# Patient Record
Sex: Male | Born: 1995 | Race: White | Hispanic: No | Marital: Single | Smoking: Current some day smoker
Health system: Southern US, Community
[De-identification: ages and names within clinical notes are randomized; demographics above are authoritative.]

## PROBLEM LIST (undated history)

## (undated) DIAGNOSIS — L0291 Cutaneous abscess, unspecified: Secondary | ICD-10-CM

## (undated) DIAGNOSIS — M40209 Unspecified kyphosis, site unspecified: Secondary | ICD-10-CM

## (undated) DIAGNOSIS — J45909 Unspecified asthma, uncomplicated: Secondary | ICD-10-CM

## (undated) DIAGNOSIS — G8929 Other chronic pain: Secondary | ICD-10-CM

## (undated) DIAGNOSIS — M419 Scoliosis, unspecified: Secondary | ICD-10-CM

## (undated) DIAGNOSIS — M549 Dorsalgia, unspecified: Secondary | ICD-10-CM

## (undated) DIAGNOSIS — R042 Hemoptysis: Secondary | ICD-10-CM

## (undated) HISTORY — PX: LIVER CYST REMOVAL: SHX5951

## (undated) HISTORY — PX: BACK SURGERY: SHX140

---
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
## 2014-01-30 ENCOUNTER — Emergency Department: Payer: Self-pay | Admitting: Internal Medicine

## 2014-02-18 ENCOUNTER — Ambulatory Visit: Payer: Self-pay | Admitting: Orthopaedic Surgery

## 2014-10-04 ENCOUNTER — Emergency Department: Payer: Self-pay | Admitting: Emergency Medicine

## 2015-02-17 ENCOUNTER — Ambulatory Visit: Payer: Medicaid Other

## 2015-02-17 ENCOUNTER — Ambulatory Visit
Admission: EM | Admit: 2015-02-17 | Discharge: 2015-02-17 | Disposition: A | Discharge status: Home / Self Care | Payer: Medicaid Other | Attending: Internal Medicine | Admitting: Internal Medicine

## 2015-02-17 ENCOUNTER — Encounter: Payer: Self-pay | Admitting: Emergency Medicine

## 2015-02-17 DIAGNOSIS — F17200 Nicotine dependence, unspecified, uncomplicated: Secondary | ICD-10-CM | POA: Insufficient documentation

## 2015-02-17 DIAGNOSIS — G8929 Other chronic pain: Secondary | ICD-10-CM | POA: Diagnosis not present

## 2015-02-17 DIAGNOSIS — M40209 Unspecified kyphosis, site unspecified: Secondary | ICD-10-CM | POA: Diagnosis not present

## 2015-02-17 DIAGNOSIS — M419 Scoliosis, unspecified: Secondary | ICD-10-CM | POA: Insufficient documentation

## 2015-02-17 DIAGNOSIS — J45909 Unspecified asthma, uncomplicated: Secondary | ICD-10-CM | POA: Diagnosis not present

## 2015-02-17 DIAGNOSIS — M549 Dorsalgia, unspecified: Secondary | ICD-10-CM

## 2015-02-17 HISTORY — DX: Dorsalgia, unspecified: M54.9

## 2015-02-17 HISTORY — DX: Other chronic pain: G89.29

## 2015-02-17 HISTORY — DX: Unspecified kyphosis, site unspecified: M40.209

## 2015-02-17 HISTORY — DX: Cutaneous abscess, unspecified: L02.91

## 2015-02-17 HISTORY — DX: Hemoptysis: R04.2

## 2015-02-17 HISTORY — DX: Unspecified asthma, uncomplicated: J45.909

## 2015-02-17 MED ORDER — OXYCODONE-ACETAMINOPHEN 5-325 MG PO TABS: 1.0000 | ORAL_TABLET | ORAL | Status: DC | PRN

## 2015-02-17 MED ORDER — IBUPROFEN 800 MG PO TABS: 800.0000 mg | ORAL_TABLET | Freq: Three times a day (TID) | ORAL | Status: DC

## 2015-02-17 MED ORDER — KETOROLAC TROMETHAMINE 60 MG/2ML IM SOLN
60.0000 mg | Freq: Once | INTRAMUSCULAR | Status: AC
  Administered 2015-02-17: 60 mg via INTRAMUSCULAR

## 2015-02-17 MED ORDER — OMEPRAZOLE 20 MG PO CPDR: 20.0000 mg | DELAYED_RELEASE_CAPSULE | Freq: Every day | ORAL | Status: DC
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
## 2015-02-17 NOTE — Discharge Instructions (Signed)
For difficulty with long-standing pain, a visit to a pain management specialist may be helpful.   Scoliosis Scoliosis is the name given to a spine that curves sideways.Scoliosis can cause twisting of your shoulders, hips, chest, back, and rib cage.  CAUSES  The cause of scoliosis is not always known. It may be caused by a birth defect or by a disease that can cause muscular dysfunction and imbalance, such as cerebral palsy and muscular dystrophy.  RISK FACTORS Having a disease that causes muscle disease or dysfunction. SIGNS AND SYMPTOMS Scoliosis often has no signs or symptoms.If they are present, they may include:  Unequal size of one body side compared to the other (asymmetry).  Visible curvature of the spine.  Pain. The pain may limit physical activity.  Shortness of breath.  Bowel or bladder issues. DIAGNOSIS A skilled health care provider will perform an evaluation. This will involve:  Taking your history.  Performing a physical examination.  Performing a neurological exam to detect nerve or muscle function loss.  Range of motion studies on the spine.  X-rays. An MRI may also be obtained. TREATMENT  Treatment varies depending on the nature, extent, and severity of the disease. If the curvature is not great, you may need only observation. A brace may be used to prevent scoliosis from progressing. A brace may also be needed during growth spurts. Physical therapy may be of benefit. Surgery may be required.  HOME CARE INSTRUCTIONS   Your health care provider may suggest exercises to strengthen your muscles. Perform them as directed.  Ask your health care provider before participating in any sports.   If you have been prescribed an orthopedic brace, wear it as instructed by your health care provider. SEEK MEDICAL CARE IF: Your brace causes the skin to become sore (chafe) or is uncomfortable.  SEEK IMMEDIATE MEDICAL CARE IF:  You have back pain that is not relieved  by the medicines prescribed by your health care provider.   Your legs feel weak or you lose function in your legs.  You lose some bowel or bladder control.  Document Released: 09/24/2000 Document Revised: 10/02/2013 Document Reviewed: 06/04/2013 [facility] Patient Information 2015 [place], [place]. This information is not intended to replace advice given to you by your health care provider. Make sure you discuss any questions you have with your health care provider.

## 2015-02-26 ENCOUNTER — Ambulatory Visit
Admission: EM | Admit: 2015-02-26 | Discharge: 2015-02-26 | Disposition: A | Discharge status: Home / Self Care | Payer: Medicaid Other | Attending: Family Medicine | Admitting: Family Medicine

## 2015-02-26 DIAGNOSIS — G8929 Other chronic pain: Secondary | ICD-10-CM

## 2015-02-26 DIAGNOSIS — M549 Dorsalgia, unspecified: Secondary | ICD-10-CM | POA: Diagnosis not present

## 2015-02-26 MED ORDER — OXYCODONE-ACETAMINOPHEN 5-325 MG PO TABS: ORAL_TABLET | ORAL | Status: DC

## 2015-02-26 MED ORDER — IBUPROFEN 800 MG PO TABS: 800.0000 mg | ORAL_TABLET | Freq: Three times a day (TID) | ORAL | Status: DC

## 2015-02-26 MED ORDER — CYCLOBENZAPRINE HCL 10 MG PO TABS: 10.0000 mg | ORAL_TABLET | Freq: Every day | ORAL | Status: DC

## 2015-02-26 MED ORDER — CYCLOBENZAPRINE HCL 10 MG PO TABS
10.0000 mg | ORAL_TABLET | Freq: Every day | ORAL | Status: DC
Start: 2015-02-26 — End: 2016-04-06

## 2015-02-26 MED ORDER — CYCLOBENZAPRINE HCL 10 MG PO TABS
10.0000 mg | ORAL_TABLET | Freq: Every day | ORAL | Status: DC
Start: 2015-02-26 — End: 2018-06-14

## 2015-02-26 NOTE — ED Notes (Signed)
Patient states that he has had back pain 2 weeks. Patient states that he has had back pain all his life. He states that he has Kyphosis of the spine.

## 2015-02-26 NOTE — ED Provider Notes (Signed)
CSN: [id number]     Arrival date & time 02/26/15  1945 History   First MD Initiated Contact with Patient 02/26/15 2023     Chief Complaint  Patient presents with  . Back Pain    HPI Comments: Patient with a h/o kyphosis and chronic back pain presents with mid back pain. States ran out of the pain medications given here last week. States still awaiting to get in to see pain specialist. Also states called his PCP but told that " I was not their patient anymore". Denies shortness of breath, chest pains.   The history is provided by the patient.    Past Medical History  Diagnosis Date  . Asthma   . Coughing up blood   . Kyphosis   . Chronic back pain   . Abscess    Past Surgical History  Procedure Laterality Date  . Liver cyst removal     No family history on file. History  Substance Use Topics  . Smoking status: Never Smoker   . Smokeless tobacco: Current User    Types: Chew  . Alcohol Use: No    Review of Systems  Allergies  Bee venom and Augmentin  Home Medications   Prior to Admission medications   Medication Sig Start Date End Date Taking? Authorizing Provider  ALBUTEROL SULFATE HFA IN Inhale into the lungs.   Yes Historical Provider, MD  cetirizine (ZYRTEC) 10 MG tablet Take 10 mg by mouth daily.   Yes Historical Provider, MD  omeprazole (PRILOSEC) 20 MG capsule Take 20 mg by mouth daily.   Yes Historical Provider, MD  omeprazole (PRILOSEC) 20 MG capsule Take 1 capsule (20 mg total) by mouth daily. 02/17/15 03/20/15 Yes Eustace MooreLaura W Murray, MD  cyclobenzaprine (FLEXERIL) 10 MG tablet Take 1 tablet (10 mg total) by mouth at bedtime. 02/26/15   Payton Mccallumrlando Ashtan Laton, MD  cyclobenzaprine (FLEXERIL) 10 MG tablet Take 1 tablet (10 mg total) by mouth at bedtime. 02/26/15   Payton Mccallumrlando Morayo Leven, MD  ibuprofen (ADVIL,MOTRIN) 800 MG tablet Take 1 tablet (800 mg total) by mouth 3 (three) times daily. 02/26/15   Payton Mccallumrlando Mikhi Athey, MD  oxyCODONE-acetaminophen (PERCOCET/ROXICET) 5-325 MG per tablet 1 tab po  qd prn 02/26/15   Payton Mccallumrlando Jonna Dittrich, MD   BP 148/95 mmHg  Pulse 111  Temp(Src) 98.2 F (36.8 C) (Oral)  Resp 18  Ht 6\' 3"  (1.905 m)  Wt 230 lb (104.327 kg)  BMI 28.75 kg/m2  SpO2 98% Physical Exam  Constitutional: He appears well-developed and well-nourished. No distress.  Pulmonary/Chest: Effort normal. No respiratory distress.  Musculoskeletal: Normal range of motion. He exhibits no edema or tenderness.       Thoracic back: He exhibits deformity and spasm. He exhibits no bony tenderness, no swelling, no edema and no laceration.  Kyphosis gross deformity noted; no skin lesions, edema or erythema noted;   Skin: He is not diaphoretic.  Nursing note reviewed.   ED Course  Procedures (including critical care time) Labs Review Labs Reviewed - No data to display  Imaging Review No results found.   MDM   1. Chronic back pain    Discharge Medication List as of 02/26/2015  8:57 PM    START taking these medications   Details  !! cyclobenzaprine (FLEXERIL) 10 MG tablet Take 1 tablet (10 mg total) by mouth at bedtime., Starting 02/26/2015, Until Discontinued, Normal    !! cyclobenzaprine (FLEXERIL) 10 MG tablet Take 1 tablet (10 mg total) by mouth at bedtime., Starting 02/26/2015, Until  Discontinued, Print     !! - Potential duplicate medications found. Please discuss with provider.    Plan: 1. Test/x-ray results and diagnosis reviewed with patient 2. rx as per orders; risks, benefits, potential side effects reviewed with patient 3. Recommend supportive treatment with warm compresses to affected area 4. Recommend f/u with PCP and pain specialist for further management of this chronic condition 5. F/u prn if symptoms worsen or don't improve    Payton Mccallumrlando Vandy Fong, MD 02/28/15 1430

## 2015-05-28 ENCOUNTER — Emergency Department
Admission: EM | Admit: 2015-05-28 | Discharge: 2015-05-28 | Disposition: A | Discharge status: Home / Self Care | Payer: Medicaid Other | Attending: Student | Admitting: Student

## 2015-05-28 ENCOUNTER — Encounter: Payer: Self-pay | Admitting: Urgent Care

## 2015-05-28 DIAGNOSIS — Z62820 Parent-biological child conflict: Secondary | ICD-10-CM | POA: Insufficient documentation

## 2015-05-28 DIAGNOSIS — F1210 Cannabis abuse, uncomplicated: Secondary | ICD-10-CM | POA: Diagnosis not present

## 2015-05-28 DIAGNOSIS — F1110 Opioid abuse, uncomplicated: Secondary | ICD-10-CM | POA: Diagnosis not present

## 2015-05-28 DIAGNOSIS — G8911 Acute pain due to trauma: Secondary | ICD-10-CM | POA: Diagnosis not present

## 2015-05-28 DIAGNOSIS — Z046 Encounter for general psychiatric examination, requested by authority: Secondary | ICD-10-CM | POA: Diagnosis present

## 2015-05-28 DIAGNOSIS — Z79891 Long term (current) use of opiate analgesic: Secondary | ICD-10-CM | POA: Diagnosis not present

## 2015-05-28 DIAGNOSIS — Z791 Long term (current) use of non-steroidal anti-inflammatories (NSAID): Secondary | ICD-10-CM | POA: Insufficient documentation

## 2015-05-28 DIAGNOSIS — Z79899 Other long term (current) drug therapy: Secondary | ICD-10-CM | POA: Insufficient documentation

## 2015-05-28 DIAGNOSIS — M79641 Pain in right hand: Secondary | ICD-10-CM | POA: Diagnosis not present

## 2015-05-28 DIAGNOSIS — Z880 Allergy status to penicillin: Secondary | ICD-10-CM | POA: Insufficient documentation

## 2015-05-28 HISTORY — DX: Scoliosis, unspecified: M41.9

## 2015-05-28 LAB — CBC
HCT: 47.8 % (ref 40.0–52.0)
Hemoglobin: 16.1 g/dL (ref 13.0–18.0)
MCH: 30.2 pg (ref 26.0–34.0)
MCHC: 33.6 g/dL (ref 32.0–36.0)
MCV: 89.7 fL (ref 80.0–100.0)
PLATELETS: 248 10*3/uL (ref 150–440)
RBC: 5.33 MIL/uL (ref 4.40–5.90)
RDW: 12.8 % (ref 11.5–14.5)
WBC: 13.5 10*3/uL — ABNORMAL HIGH (ref 3.8–10.6)

## 2015-05-28 LAB — COMPREHENSIVE METABOLIC PANEL
ALT: 39 U/L (ref 17–63)
ANION GAP: 9 (ref 5–15)
AST: 33 U/L (ref 15–41)
Albumin: 4.7 g/dL (ref 3.5–5.0)
Alkaline Phosphatase: 54 U/L (ref 38–126)
BUN: 10 mg/dL (ref 6–20)
CHLORIDE: 105 mmol/L (ref 101–111)
CO2: 28 mmol/L (ref 22–32)
Calcium: 9.2 mg/dL (ref 8.9–10.3)
Creatinine, Ser: 1.01 mg/dL (ref 0.61–1.24)
GFR calc non Af Amer: >60 mL/min (ref >60)
Glucose, Bld: 86 mg/dL (ref 65–99)
Potassium: 3.3 mmol/L — ABNORMAL LOW (ref 3.5–5.1)
SODIUM: 142 mmol/L (ref 135–145)
Total Bilirubin: 0.8 mg/dL (ref 0.3–1.2)
Total Protein: 7.5 g/dL (ref 6.5–8.1)

## 2015-05-28 LAB — URINE DRUG SCREEN, QUALITATIVE (ARMC ONLY)
Amphetamines, Ur Screen: NOT DETECTED
BARBITURATES, UR SCREEN: NOT DETECTED
Benzodiazepine, Ur Scrn: NOT DETECTED
CANNABINOID 50 NG, UR ~~LOC~~: POSITIVE — AB
Cocaine Metabolite,Ur ~~LOC~~: NOT DETECTED
MDMA (ECSTASY) UR SCREEN: NOT DETECTED
Methadone Scn, Ur: NOT DETECTED
Opiate, Ur Screen: POSITIVE — AB
Phencyclidine (PCP) Ur S: NOT DETECTED
TRICYCLIC, UR SCREEN: NOT DETECTED

## 2015-05-28 LAB — ETHANOL

## 2015-05-28 LAB — ACETAMINOPHEN LEVEL: Acetaminophen (Tylenol), Serum: <10 ug/mL — ABNORMAL LOW (ref 10–30)

## 2015-05-28 LAB — SALICYLATE LEVEL

## 2015-05-28 MED ORDER — FLUOXETINE HCL 20 MG PO CAPS
20.0000 mg | ORAL_CAPSULE | Freq: Every day | ORAL | Status: DC
  Administered 2015-05-28: 20 mg via ORAL
  Filled 2015-05-28: qty 1

## 2015-05-28 MED ORDER — METOPROLOL TARTRATE 25 MG PO TABS
25.0000 mg | ORAL_TABLET | Freq: Once | ORAL | Status: AC
  Administered 2015-05-28: 25 mg via ORAL
  Filled 2015-05-28: qty 1

## 2015-05-28 MED ORDER — CETIRIZINE HCL 10 MG PO TABS
10.0000 mg | ORAL_TABLET | Freq: Once | ORAL | Status: AC
  Administered 2015-05-28: 10 mg via ORAL

## 2015-05-28 MED ORDER — TIZANIDINE HCL 4 MG PO TABS
4.0000 mg | ORAL_TABLET | Freq: Three times a day (TID) | ORAL | Status: DC
  Filled 2015-05-28 (×3): qty 1

## 2015-05-28 MED ORDER — HYDROCODONE-ACETAMINOPHEN 5-325 MG PO TABS
1.0000 | ORAL_TABLET | Freq: Once | ORAL | Status: AC
  Administered 2015-05-28: 1 via ORAL
  Filled 2015-05-28: qty 1

## 2015-05-28 MED ORDER — HYDROCODONE-ACETAMINOPHEN 5-325 MG PO TABS
1.0000 | ORAL_TABLET | ORAL | Status: DC | PRN
  Administered 2015-05-28: 1 via ORAL
  Filled 2015-05-28: qty 1

## 2015-05-28 MED ORDER — GABAPENTIN 300 MG PO CAPS
300.0000 mg | ORAL_CAPSULE | Freq: Three times a day (TID) | ORAL | Status: DC
  Administered 2015-05-28: 300 mg via ORAL
  Filled 2015-05-28: qty 1

## 2015-05-28 MED ORDER — CETIRIZINE HCL 10 MG PO TABS
ORAL_TABLET | ORAL | Status: AC
  Administered 2015-05-28: 10 mg via ORAL
  Filled 2015-05-28: qty 1

## 2015-05-28 NOTE — ED Notes (Signed)

## 2015-05-28 NOTE — ED Notes (Signed)
NAD noted at time of D/C. Pt alert and oriented. Pt ambulatory to the lobby at this time.
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
## 2015-05-28 NOTE — BH Assessment (Signed)
Assessment Note  [name] is an 19 y.o. male. [name] reports to the ED under IVC.  [name] denied being depressed or anxious.  He denied having auditory or visual hallucinations.  He denied having homicidal or suicidal ideation or intent.  [name] reports that his mother took out the IVC papers on him, because she was mad at him and wants to discredit him for a court case scheduled for Friday of this week.  IVC documents state that the patient has scoliosis and a hand injury that causes him pain.  She is reported as going to the magistrate and securing The IVC documents against himself and his girlfriend.  The IVC stated that [name] would kill himself to help manage the pain he is in. Papers state that he would cut himself with a knife if he cant find a way to ease his pain.   Axis I: See current hospital problem list Axis II: Deferred Axis III:  Past Medical History  Diagnosis Date  . Asthma   . Coughing up blood   . Kyphosis   . Chronic back pain   . Abscess   . Scoliosis    Axis IV: problems with primary support group Axis V: 61-70 mild symptoms  Past Medical History:  Past Medical History  Diagnosis Date  . Asthma   . Coughing up blood   . Kyphosis   . Chronic back pain   . Abscess   . Scoliosis     Past Surgical History  Procedure Laterality Date  . Liver cyst removal      Family History: No family history on file.  Social History:  reports that he has never smoked. His smokeless tobacco use includes Chew. He reports that he does not drink alcohol. His drug history is not on file.  Additional Social History:  Alcohol / Drug Use History of alcohol / drug use?: No history of alcohol / drug abuse  CIWA: CIWA-Ar BP: (!) 182/98 mmHg Pulse Rate: 85 COWS:    Allergies:  Allergies  Allergen Reactions  . Bee Venom Swelling  . Augmentin [Amoxicillin-Pot Clavulanate] Rash    Home Medications:  (Not in a hospital admission)  OB/GYN Status:  No LMP for  male patient.  General Assessment Data Location of Assessment: [facility] ED TTS Assessment: In system Is this a Tele or Face-to-Face Assessment?: Face-to-Face Is this an Initial Assessment or a Re-assessment for this encounter?: Initial Assessment Marital status: Single Is patient pregnant?: No Pregnancy Status: No Living Arrangements: Non-relatives/Friends (Girlfriend) Can pt return to current living arrangement?: Yes Admission Status: Involuntary Is patient capable of signing voluntary admission?: Yes Referral Source: MD Insurance type: Medicaid  Medical Screening Exam [facility] Walk-in ONLY) Medical Exam completed: Yes  Crisis Care Plan Living Arrangements: Non-relatives/Friends (Girlfriend) Name of Psychiatrist: None reported Name of Therapist: None reported  Education Status Is patient currently in school?: Yes Current Grade: College Highest grade of school patient has completed: [employer] Automotive [employer] Name of school: [employer] person: na  Risk to self with the past 6 months Suicidal Ideation: No Has patient been a risk to self within the past 6 months prior to admission? : No Suicidal Intent: No Has patient had any suicidal intent within the past 6 months prior to admission? : No Is patient at risk for suicide?: No Suicidal Plan?: No Has patient had any suicidal plan within the past 6 months prior to admission? : No Access to Means: No What has  been your use of drugs/alcohol within the last 12 months?: none reported Previous Attempts/Gestures: No How many times?: 0 Other Self Harm Risks: none reported Triggers for Past Attempts: None known Intentional Self Injurious Behavior: None Family Suicide History: No Recent stressful life event(s): Conflict (Comment), Legal Issues (Parental conflict) Persecutory voices/beliefs?: No Depression: No Depression Symptoms:  (None reported) Substance abuse history and/or treatment for substance abuse?:  No Suicide prevention information given to non-admitted patients: Not applicable  Risk to Others within the past 6 months Homicidal Ideation: No Does patient have any lifetime risk of violence toward others beyond the six months prior to admission? : No Thoughts of Harm to Others: No Current Homicidal Intent: No Current Homicidal Plan: No Access to Homicidal Means: No Identified Victim: None identified History of harm to others?: No Assessment of Violence: None Noted Violent Behavior Description: none reported Does patient have access to weapons?: No Criminal Charges Pending?: Yes Describe Pending Criminal Charges: Assault Does patient have a court date: Yes Court Date: 05/30/15 Is patient on probation?: No  Psychosis Hallucinations: None noted Delusions: None noted  Mental Status Report Appearance/Hygiene: In scrubs, Unremarkable Eye Contact: Good Motor Activity: Unremarkable Speech: Unremarkable Level of Consciousness: Alert Mood: Euthymic Affect: Appropriate to circumstance Anxiety Level: None Thought Processes: Coherent Judgement: Unimpaired Orientation: Person, Place, Time, Situation Obsessive Compulsive Thoughts/Behaviors: None     ADLScreening [facility] Assessment Services) Patient's cognitive ability adequate to safely complete daily activities?: Yes Patient able to express need for assistance with ADLs?: Yes Independently performs ADLs?: Yes (appropriate for developmental age)  Prior Inpatient Therapy Prior Inpatient Therapy: No  Prior Outpatient Therapy Prior Outpatient Therapy: No Does patient have an ACCT team?: No Does patient have Intensive In-House Services?  : No Does patient have [facility] services? : No Does patient have P4CC services?: No  ADL Screening (condition at time of admission) Patient's cognitive ability adequate to safely complete daily activities?: Yes Patient able to express need for assistance with ADLs?: Yes Independently performs  ADLs?: Yes (appropriate for developmental age)       Abuse/Neglect Assessment (Assessment to be complete while patient is alone) Physical Abuse: Denies Verbal Abuse: Denies Sexual Abuse: Denies Exploitation of patient/patient's resources: Denies Self-Neglect: Denies Values / Beliefs Cultural Requests During Hospitalization: None Spiritual Requests During Hospitalization: None   Advance Directives (For Healthcare) Does patient have an advance directive?: No Would patient like information on creating an advanced directive?: No - patient declined information    Additional Information 1:1 In Past 12 Months?: No CIRT Risk: No Elopement Risk: No Does patient have medical clearance?: Yes     Disposition:  Disposition Initial Assessment Completed for this Encounter: Yes Disposition of Patient:  (SOC to be completed)  On Site Evaluation by:   Reviewed with Physician:    Justice Deeds 05/28/2015 4:47 AM

## 2015-05-28 NOTE — ED Notes (Signed)
BEHAVIORAL HEALTH ROUNDING Patient sleeping: No. Patient alert and oriented: yes Behavior appropriate: Yes.  ; If no, describe:  Nutrition and fluids offered: Yes  Toileting and hygiene offered: Yes  Sitter present: not applicable Law enforcement present: Yes  

## 2015-05-28 NOTE — ED Notes (Signed)
Spoke with Dr. Leretha Pol from [facility]. Per Dr. Leretha Pol she is going to rescind his IVC.
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
## 2015-05-28 NOTE — ED Provider Notes (Signed)
-----------------------------------------   10:43 AM on 05/28/2015 -----------------------------------------  Dr. Leretha Pol, specialist on call, has evaluated the patient, recommends discharge, she has rescinded the IVC. Vitals stable with the exception of mild diastolic hypertension. DC home.  Gayla Doss, MD 05/28/15 1045

## 2015-05-28 NOTE — ED Notes (Signed)
BEHAVIORAL HEALTH ROUNDING Patient sleeping: No. Patient alert and oriented: yes Behavior appropriate: Yes.  ; If no, describe:  Nutrition and fluids offered: Yes Toileting and hygiene offered: Yes  Sitter present: yes Law enforcement present: Yes [employer]  

## 2015-05-28 NOTE — ED Notes (Signed)
ED BHU PLACEMENT JUSTIFICATION Is the patient under IVC or is there intent for IVC: Yes.   Is the patient medically cleared: Yes.   Is there vacancy in the ED BHU: No. Is the population mix appropriate for patient: Yes.   Is the patient awaiting placement in inpatient or outpatient setting: Yes.   Has the patient had a psychiatric consult: Yes.   Survey of unit performed for contraband, proper placement and condition of furniture, tampering with fixtures in bathroom, shower, and each patient room: Yes.  ; Findings:  APPEARANCE/BEHAVIOR calm, cooperative and adequate rapport can be established NEURO ASSESSMENT Orientation: time, place and person Hallucinations: No.None noted (Hallucinations) Speech: Normal Gait: normal RESPIRATORY ASSESSMENT Normal expansion.  Clear to auscultation.  No rales, rhonchi, or wheezing. CARDIOVASCULAR ASSESSMENT regular rate and rhythm, S1, S2 normal, no murmur, click, rub or gallop GASTROINTESTINAL ASSESSMENT soft, nontender, BS WNL, no r/g EXTREMITIES normal strength, tone, and muscle mass PLAN OF CARE Provide calm/safe environment. Vital signs assessed twice daily. ED BHU Assessment once each 12-hour shift. Collaborate with intake RN daily or as condition indicates. Assure the ED provider has rounded once each shift. Provide and encourage hygiene. Provide redirection as needed. Assess for escalating behavior; address immediately and inform ED provider.  Assess family dynamic and appropriateness for visitation as needed: Yes.  ; If necessary, describe findings:  Educate the patient/family about BHU procedures/visitation: Yes.  ; If necessary, describe findings:  

## 2015-06-02 NOTE — ED Provider Notes (Signed)
[facility] Emergency Department Provider Note  ____________________________________________  Time seen: Approximately 251 AM on 05/28/15  I have reviewed the triage vital signs and the nursing notes.   HISTORY  Chief Complaint Psychiatric Evaluation    HPI [name] is a 19 y.o. male who reports that he was involuntarily committed by his mother. The patient reports that his mom said he wanted to hurt himself. The patient reports that his mother shiny get back at him for the court date that they have. The patient reports that he did not speak to his mother yesterday and he has class in the morning. The patient reports that he recently fractured his hand. The patient's mother reports that he has been taking medications for his back pain and she reports this changing his behavior. The patient also reports that his mother involuntarily committed his girlfriend who was seen and discharged earlier in the evening.   Past Medical History  Diagnosis Date  . Asthma   . Coughing up blood   . Kyphosis   . Chronic back pain   . Abscess   . Scoliosis     There are no active problems to display for this patient.   Past Surgical History  Procedure Laterality Date  . Liver cyst removal      Current Outpatient Rx  Name  Route  Sig  Dispense  Refill  . ALBUTEROL SULFATE HFA IN   Inhalation   Inhale into the lungs.         . cetirizine (ZYRTEC) 10 MG tablet   Oral   Take 10 mg by mouth daily.         Marland Kitchen FLUoxetine (PROZAC) 20 MG tablet   Oral   Take 20 mg by mouth daily.         Marland Kitchen gabapentin (NEURONTIN) 300 MG capsule   Oral   Take 300 mg by mouth 3 (three) times daily.         Marland Kitchen ibuprofen (ADVIL,MOTRIN) 800 MG tablet   Oral   Take 1 tablet (800 mg total) by mouth 3 (three) times daily.   30 tablet   0   . omeprazole (PRILOSEC) 40 MG capsule   Oral   Take 40 mg by mouth daily.         Marland Kitchen oxyCODONE-acetaminophen (PERCOCET/ROXICET) 5-325  MG per tablet      1 tab po qd prn   8 tablet   0   . pregabalin (LYRICA) 50 MG capsule   Oral   Take 50 mg by mouth 3 (three) times daily.         Marland Kitchen tiZANidine (ZANAFLEX) 4 MG tablet   Oral   Take 4 mg by mouth every 8 (eight) hours as needed for muscle spasms.         . cyclobenzaprine (FLEXERIL) 10 MG tablet   Oral   Take 1 tablet (10 mg total) by mouth at bedtime.   15 tablet   0   . cyclobenzaprine (FLEXERIL) 10 MG tablet   Oral   Take 1 tablet (10 mg total) by mouth at bedtime.   15 tablet   0   . omeprazole (PRILOSEC) 20 MG capsule   Oral   Take 20 mg by mouth daily.         Marland Kitchen EXPIRED: omeprazole (PRILOSEC) 20 MG capsule   Oral   Take 1 capsule (20 mg total) by mouth daily.   30 capsule   0  Allergies Bee venom and Augmentin  No family history on file.  Social History Social History  Substance Use Topics  . Smoking status: Never Smoker   . Smokeless tobacco: Current User    Types: Chew  . Alcohol Use: No    Review of Systems Constitutional: No fever/chills Eyes: No visual changes. ENT: No sore throat. Cardiovascular: Denies chest pain. Respiratory: Denies shortness of breath. Gastrointestinal: No abdominal pain.  No nausea, no vomiting.   Genitourinary: Negative for dysuria. Musculoskeletal: Right hand pain Skin: Negative for rash. Neurological: Negative for headaches, focal weakness or numbness.  10-point ROS otherwise negative.  ____________________________________________   PHYSICAL EXAM:  VITAL SIGNS: ED Triage Vitals  Enc Vitals Group     BP 05/28/15 0103 182/98 mmHg     Pulse Rate 05/28/15 0103 85     Resp 05/28/15 0103 18     Temp 05/28/15 0103 98.2 F (36.8 C)     Temp Source 05/28/15 0103 Oral     SpO2 05/28/15 0103 96 %     Weight 05/28/15 0103 224 lb (101.606 kg)     Height 05/28/15 0103  (1.854 m)     Head Cir --      Peak Flow --      Pain Score 05/28/15 0128 7     Pain Loc --      Pain Edu? --       Excl. in GC? --     Constitutional: Alert and oriented. Well appearing and in no acute distress. Eyes: Conjunctivae are normal. PERRL. EOMI. Head: Atraumatic. Nose: No congestion/rhinnorhea. Mouth/Throat: Mucous membranes are moist.  Oropharynx non-erythematous. Cardiovascular: Normal rate, regular rhythm. Grossly normal heart sounds.  Good peripheral circulation. Respiratory: Normal respiratory effort.  No retractions. Lungs CTAB. Gastrointestinal: Soft and nontender. No distention. No abdominal bruits. No CVA tenderness. Musculoskeletal: Right hand bandaged with some mild swelling Neurologic:  Normal speech and language. No gross focal neurologic deficits are appreciated.  Skin:  Skin is warm, dry and intact. No rash noted. Psychiatric: Mood and affect are normal.   ____________________________________________   LABS (all labs ordered are listed, but only abnormal results are displayed)  Labs Reviewed  COMPREHENSIVE METABOLIC PANEL - Abnormal; Notable for the following:    Potassium 3.3 (*)    All other components within normal limits  ACETAMINOPHEN LEVEL - Abnormal; Notable for the following:    Acetaminophen (Tylenol), Serum <10 (*)    All other components within normal limits  CBC - Abnormal; Notable for the following:    WBC 13.5 (*)    All other components within normal limits  URINE DRUG SCREEN, QUALITATIVE ([facility] ONLY) - Abnormal; Notable for the following:    Opiate, Ur Screen POSITIVE (*)    Cannabinoid 50 Ng, Ur [place] POSITIVE (*)    All other components within normal limits  ETHANOL  SALICYLATE LEVEL   ____________________________________________  EKG  None ____________________________________________  RADIOLOGY  None ____________________________________________   PROCEDURES  Procedure(s) performed: None  Critical Care performed: No  ____________________________________________   INITIAL IMPRESSION / ASSESSMENT AND PLAN / ED  COURSE  Pertinent labs & imaging results that were available during my care of the patient were reviewed by me and considered in my medical decision making (see chart for details).  This is a 19 year old male who was involuntarily committed by his mother. The patient reports that he did not make any threats against himself and his mother's trying to get back at him. He reports he  discussed this with the police and they state that he can sue her for defamation of character. Since the patient is under involuntary commitment I will have him evaluated by the specialist on-call who will evaluate him for suicidal ideation. Otherwise the patient has no further complaints or concerns. I did give him some Percocet for his hand pain which she reports is in an Ace wrap. The patient's care was sent at Dr. Chari Manning who will follow up the results of the [facility] evaluation ____________________________________________   FINAL CLINICAL IMPRESSION(S) / ED DIAGNOSES  Final diagnoses:  Marijuana abuse  Parent-child conflict      Rebecka Apley, MD 06/02/15 [phone]

## 2015-06-22 ENCOUNTER — Ambulatory Visit
Admission: EM | Admit: 2015-06-22 | Discharge: 2015-06-22 | Disposition: A | Discharge status: Home / Self Care | Payer: Medicaid Other | Attending: Family Medicine | Admitting: Family Medicine

## 2015-06-22 ENCOUNTER — Encounter: Payer: Self-pay | Admitting: Emergency Medicine

## 2015-06-22 DIAGNOSIS — M546 Pain in thoracic spine: Secondary | ICD-10-CM | POA: Diagnosis not present

## 2015-06-22 DIAGNOSIS — M40203 Unspecified kyphosis, cervicothoracic region: Secondary | ICD-10-CM

## 2015-06-22 MED ORDER — PROMETHAZINE HCL 25 MG/ML IJ SOLN
25.0000 mg | Freq: Once | INTRAMUSCULAR | Status: AC
  Administered 2015-06-22: 25 mg via INTRAMUSCULAR

## 2015-06-22 MED ORDER — HYDROMORPHONE HCL 1 MG/ML IJ SOLN
1.0000 mg | Freq: Once | INTRAMUSCULAR | Status: AC
  Administered 2015-06-22: 1 mg via INTRAMUSCULAR

## 2015-06-22 MED ORDER — KETOROLAC TROMETHAMINE 60 MG/2ML IM SOLN
60.0000 mg | Freq: Once | INTRAMUSCULAR | Status: AC
  Administered 2015-06-22: 60 mg via INTRAMUSCULAR

## 2015-06-22 NOTE — ED Notes (Signed)
Patient explained the need to find a PCP and pain management to get his prescriptions filled, given the owrk note for tomorrow, states that he cannot skip work as he recently started to work, patient explainted to use the [employer] note in case if he feels worse, pt verbalzies understanding

## 2015-06-22 NOTE — Discharge Instructions (Signed)
Back Pain, Adult °Back pain is very common. The pain often gets better over time. The cause of back pain is usually not dangerous. Most people can learn to manage their back pain on their own.  °HOME CARE  °· Stay active. Start with short walks on flat ground if you can. Try to walk farther each day. °· Do not sit, drive, or stand in one place for more than 30 minutes. Do not stay in bed. °· Do not avoid exercise or work. Activity can help your back heal faster. °· Be careful when you bend or lift an object. Bend at your knees, keep the object close to you, and do not twist. °· Sleep on a firm mattress. Lie on your side, and bend your knees. If you lie on your back, put a pillow under your knees. °· Only take medicines as told by your doctor. °· Put ice on the injured area. °¨ Put ice in a plastic bag. °¨ Place a towel between your skin and the bag. °¨ Leave the ice on for 15-20 minutes, 03-04 times a day for the first 2 to 3 days. After that, you can switch between ice and heat packs. °· Ask your doctor about back exercises or massage. °· Avoid feeling anxious or stressed. Find good ways to deal with stress, such as exercise. °GET HELP RIGHT AWAY IF:  °· Your pain does not go away with rest or medicine. °· Your pain does not go away in 1 week. °· You have new problems. °· You do not feel well. °· The pain spreads into your legs. °· You cannot control when you poop (bowel movement) or pee (urinate). °· Your arms or legs feel weak or lose feeling (numbness). °· You feel sick to your stomach (nauseous) or throw up (vomit). °· You have belly (abdominal) pain. °· You feel like you may pass out (faint). °MAKE SURE YOU:  °· Understand these instructions. °· Will watch your condition. °· Will get help right away if you are not doing well or get worse. °Document Released: 03/15/2008 Document Revised: 12/20/2011 Document Reviewed: 01/29/2014 °Exit[employer]® Patient Information ©2015 [employer]. This information is not intended  to replace advice given to you by your health care provider. Make sure you discuss any questions you have with your health care provider. ° °Chronic Back Pain ° When back pain lasts longer than 3 months, it is called chronic back pain. People with chronic back pain often go through certain periods that are more intense (flare-ups).  °CAUSES °Chronic back pain can be caused by wear and tear (degeneration) on different structures in your back. These structures include: °· The bones of your spine (vertebrae) and the joints surrounding your spinal cord and nerve roots (facets). °· The strong, fibrous tissues that connect your vertebrae (ligaments). °Degeneration of these structures may result in pressure on your nerves. This can lead to constant pain. °HOME CARE INSTRUCTIONS °· Avoid bending, heavy lifting, prolonged sitting, and activities which make the problem worse. °· Take brief periods of rest throughout the day to reduce your pain. Lying down or standing usually is better than sitting while you are resting. °· Take over-the-counter or prescription medicines only as directed by your caregiver. °SEEK IMMEDIATE MEDICAL CARE IF:  °· You have weakness or numbness in one of your legs or feet. °· You have trouble controlling your bladder or bowels. °· You have nausea, vomiting, abdominal pain, shortness of breath, or fainting. °Document Released: 11/04/2004 Document Revised: 12/20/2011 Document   Reviewed: 09/11/2011 °Exit[employer]® Patient Information ©2015 [employer]. This information is not intended to replace advice given to you by your health care provider. Make sure you discuss any questions you have with your health care provider. ° °

## 2015-06-22 NOTE — ED Notes (Signed)
Declined to stay 15 min after the meds ,

## 2015-06-22 NOTE — ED Provider Notes (Signed)
CSN: [id number]     Arrival date & time 06/22/15  1042 History   First MD Initiated Contact with Patient 06/22/15 1148     Chief Complaint  Patient presents with  . Back Pain   (Consider location/radiation/quality/duration/timing/severity/associated sxs/prior Treatment) Patient is a 19 y.o. male presenting with back pain. The history is provided by the patient. No language interpreter was used.  Back Pain Location:  Thoracic spine Quality:  Aching and stabbing Radiates to:  Does not radiate Pain severity:  Severe Onset quality:  Sudden Duration:  10 hours Timing:  Constant Chronicity:  Chronic Context: physical stress and twisting   Context: not occupational injury   Relieved by:  Nothing Worsened by:  Ambulation and movement Associated symptoms: no abdominal pain, no chest pain and no dysuria   Risk factors comment:  History kyphosis   Past Medical History  Diagnosis Date  . Asthma   . Coughing up blood   . Kyphosis   . Chronic back pain   . Abscess   . Scoliosis    Past Surgical History  Procedure Laterality Date  . Liver cyst removal     No family history on file. Social History  Substance Use Topics  . Smoking status: Never Smoker   . Smokeless tobacco: Current User    Types: Chew  . Alcohol Use: No    Review of Systems  Constitutional: Positive for activity change.  Cardiovascular: Negative for chest pain.  Gastrointestinal: Negative for abdominal pain.  Genitourinary: Negative for dysuria.  Musculoskeletal: Positive for myalgias and back pain.  All other systems reviewed and are negative.  she states does not smoke nurse's note reviewed. Also drug query was reviewed and patient does not appear to be getting his pain medication from one source instead is beginning his pain medications from [facility] including [facility].  Allergies  Bee venom and Augmentin  Home Medications   Prior to Admission medications   Medication Sig Start  Date End Date Taking? Authorizing Provider  cetirizine (ZYRTEC) 10 MG tablet Take 10 mg by mouth daily.   Yes Historical Provider, MD  omeprazole (PRILOSEC) 20 MG capsule Take 20 mg by mouth daily.   Yes Historical Provider, MD  oxyCODONE-acetaminophen (PERCOCET/ROXICET) 5-325 MG per tablet 1 tab po qd prn 02/26/15  Yes Payton Mccallum, MD  pregabalin (LYRICA) 50 MG capsule Take 50 mg by mouth 3 (three) times daily.   Yes Historical Provider, MD  ALBUTEROL SULFATE HFA IN Inhale into the lungs.    Historical Provider, MD  cyclobenzaprine (FLEXERIL) 10 MG tablet Take 1 tablet (10 mg total) by mouth at bedtime. 02/26/15   Payton Mccallum, MD  cyclobenzaprine (FLEXERIL) 10 MG tablet Take 1 tablet (10 mg total) by mouth at bedtime. 02/26/15   Payton Mccallum, MD  FLUoxetine (PROZAC) 20 MG tablet Take 20 mg by mouth daily.    Historical Provider, MD  gabapentin (NEURONTIN) 300 MG capsule Take 300 mg by mouth 3 (three) times daily.    Historical Provider, MD  ibuprofen (ADVIL,MOTRIN) 800 MG tablet Take 1 tablet (800 mg total) by mouth 3 (three) times daily. 02/26/15   Payton Mccallum, MD  omeprazole (PRILOSEC) 20 MG capsule Take 1 capsule (20 mg total) by mouth daily. 02/17/15 03/20/15  Eustace Moore, MD  omeprazole (PRILOSEC) 40 MG capsule Take 40 mg by mouth daily.    Historical Provider, MD  tiZANidine (ZANAFLEX) 4 MG tablet Take 4 mg by mouth every 8 (eight) hours as needed  for muscle spasms.    Historical Provider, MD   Meds Ordered and Administered this Visit   Medications  ketorolac (TORADOL) injection 60 mg (60 mg Intramuscular Given 06/22/15 1158)  promethazine (PHENERGAN) injection 25 mg (25 mg Intramuscular Given 06/22/15 1237)  HYDROmorphone (DILAUDID) injection 1 mg (1 mg Intramuscular Given 06/22/15 1237)    BP 146/89 mmHg  Pulse 98  Temp(Src) 96.8 F (36 C) (Tympanic)  Resp 20  Ht  (1.88 m)  Wt 230 lb (104.327 kg)  BMI 29.52 kg/m2  SpO2 98% No data found.   Physical Exam    Constitutional: He is oriented to person, place, and time. He appears well-developed and well-nourished.  HENT:  Head: Normocephalic and atraumatic.  Eyes: Pupils are equal, round, and reactive to light.  Neck: Neck supple.  Musculoskeletal: He exhibits tenderness.       Cervical back: He exhibits pain and spasm.       Back:  Patient has marked kyphosis present. And tenderness discomfort over the area palpated.  Neurological: He is alert and oriented to person, place, and time.  Skin: Skin is warm and dry. No erythema.  Psychiatric: He has a normal mood and affect.  Vitals reviewed.   ED Course  Procedures (including critical care time)  Labs Review Labs Reviewed - No data to display  Imaging Review No results found.   Visual Acuity Review  Right Eye Distance:   Left Eye Distance:   Bilateral Distance:    Right Eye Near:   Left Eye Near:    Bilateral Near:         MDM   1. Midline thoracic back pain   2. Kyphosis of cervicothoracic region, unspecified kyphosis type    Kyphosis and back pain. Patient has had at least 5 documented episodes where he has had to get supplemental pain medication. Will administer shot of Toradol today. If he is able to get a ride or has a ride home I'll give him a shot of Dilaudid but I do not plan to prescribe any more oral pain medications to him. He needs to be established with a pain doctor. Will offer him Mobic 15 instead of Motrin.  Patient declined changing his medication as an outpatient cost. When questioned why he sings remained and Dr. for pain medication he states that this office did get him scheduled with a pain clinic but there was a question of breathing his urine when he saw the pain doctor he states he has a suit against them and was unhappy with the care they provided him. Explained to him that his his options but I no longer feel comfortable giving him oral medications. Suggest the future he is more upfront with his  history.  If he can provide a ride will give him injection of Dilaudid and Phenergan today. He was given a shot of Toradol earlier.  Hassan Rowan, MD 06/22/15 1253

## 2015-06-22 NOTE — ED Notes (Signed)
Patient presents here with c/o severe back pain from the kyposis, states that he is in [employer] job which he started recently and now his ain in the back got worse, took his lyrica and percocet this am at 5 , with no relief,

## 2015-06-23 ENCOUNTER — Ambulatory Visit
Admission: EM | Admit: 2015-06-23 | Discharge: 2015-06-23 | Disposition: A | Discharge status: Home / Self Care | Payer: Medicaid Other | Attending: Family Medicine | Admitting: Family Medicine

## 2015-06-23 DIAGNOSIS — G8929 Other chronic pain: Secondary | ICD-10-CM

## 2015-06-23 DIAGNOSIS — M549 Dorsalgia, unspecified: Secondary | ICD-10-CM

## 2015-06-23 NOTE — ED Provider Notes (Signed)
CSN: [id number]     Arrival date & time 06/23/15  1700 History   First MD Initiated Contact with Patient 06/23/15 1958     Chief Complaint  Patient presents with  . Back Pain  . Peripheral Neuropathy   (Consider location/radiation/quality/duration/timing/severity/associated sxs/prior Treatment) HPI Comments: 19 yo male with a h/o kyphosis and chronic back pain with multiple visits to urgent care with back pain, presents with a complaint of back pain. States has an appointment with a pain specialist next month but has no pain medication. Patient was seen in May with similar presentation and similar explanation of upcoming pain specialist appointment. States he saw a pain specialist in [place] , but had an issue with a urine specimen that he was told was "cold". Patient was seen here yesterday by Dr. Thurmond Butts with similar complaints and given Dilaudid and toradol.   The history is provided by the patient.    Past Medical History  Diagnosis Date  . Asthma   . Coughing up blood   . Kyphosis   . Chronic back pain   . Abscess   . Scoliosis    Past Surgical History  Procedure Laterality Date  . Liver cyst removal     Family History  Problem Relation Age of Onset  . Kyphosis Father   . Fibromyalgia Mother    Social History  Substance Use Topics  . Smoking status: Never Smoker   . Smokeless tobacco: Current User    Types: Chew  . Alcohol Use: No    Review of Systems  Allergies  Bee venom and Augmentin  Home Medications   Prior to Admission medications   Medication Sig Start Date End Date Taking? Authorizing Provider  ALBUTEROL SULFATE HFA IN Inhale into the lungs.   Yes Historical Provider, MD  cetirizine (ZYRTEC) 10 MG tablet Take 10 mg by mouth daily.   Yes Historical Provider, MD  FLUoxetine (PROZAC) 20 MG tablet Take 20 mg by mouth daily.   Yes Historical Provider, MD  gabapentin (NEURONTIN) 300 MG capsule Take 300 mg by mouth 3 (three) times daily.   Yes Historical  Provider, MD  ibuprofen (ADVIL,MOTRIN) 800 MG tablet Take 1 tablet (800 mg total) by mouth 3 (three) times daily. 02/26/15  Yes Payton Mccallum, MD  omeprazole (PRILOSEC) 40 MG capsule Take 40 mg by mouth daily.   Yes Historical Provider, MD  pregabalin (LYRICA) 50 MG capsule Take 50 mg by mouth 3 (three) times daily.   Yes Historical Provider, MD  tiZANidine (ZANAFLEX) 4 MG tablet Take 4 mg by mouth every 8 (eight) hours as needed for muscle spasms.   Yes Historical Provider, MD  cyclobenzaprine (FLEXERIL) 10 MG tablet Take 1 tablet (10 mg total) by mouth at bedtime. 02/26/15   Payton Mccallum, MD  cyclobenzaprine (FLEXERIL) 10 MG tablet Take 1 tablet (10 mg total) by mouth at bedtime. 02/26/15   Payton Mccallum, MD  omeprazole (PRILOSEC) 20 MG capsule Take 20 mg by mouth daily.    Historical Provider, MD  omeprazole (PRILOSEC) 20 MG capsule Take 1 capsule (20 mg total) by mouth daily. 02/17/15 03/20/15  Eustace Moore, MD  oxyCODONE-acetaminophen (PERCOCET/ROXICET) 5-325 MG per tablet 1 tab po qd prn 02/26/15   Payton Mccallum, MD   Meds Ordered and Administered this Visit  Medications - No data to display  BP 147/97 mmHg  Pulse 113  Resp 16  Ht 6\' 2"  (1.88 m)  Wt 230 lb (104.327 kg)  BMI 29.52 kg/m2  SpO2 100%  No data found.   Physical Exam  Constitutional: He appears well-developed and well-nourished. No distress.  Neurological: He is alert.  Skin: He is not diaphoretic.  Nursing note and vitals reviewed.   ED Course  Procedures (including critical care time)  Labs Review Labs Reviewed - No data to display  Imaging Review No results found.   Visual Acuity Review  Right Eye Distance:   Left Eye Distance:   Bilateral Distance:    Right Eye Near:   Left Eye Near:    Bilateral Near:         MDM   1. Back pain, chronic    Discussed with patient and mom, concern with multiple visits to urgent care for his chronic pain condition and statements of having a pain specialist visit  soon or coming up. As noted above patient seen yesterday here and given injections. Offered to give patient an injection of toradol, however patient stated he would go to the ED. Explained to patient he may need further pain management as an inpatient/pain consultation in the hospital due to his intractable pain.    Payton Mccallum, MD 06/23/15 2112

## 2015-06-23 NOTE — ED Notes (Signed)
Patient complains of Back Pain- chronic and neuropathy. Patient states that pain started May 15th, 2016. Patient states that he has been to his primary doctor today and she couldn't do anything. Patient states that he was told to come back here. She states that pain management cannot see him until 07/23/2015.

## 2015-09-23 ENCOUNTER — Encounter: Payer: Self-pay | Admitting: Emergency Medicine

## 2015-09-23 ENCOUNTER — Emergency Department
Admission: EM | Admit: 2015-09-23 | Discharge: 2015-09-23 | Disposition: A | Discharge status: Home / Self Care | Payer: Medicaid Other | Attending: Emergency Medicine | Admitting: Emergency Medicine

## 2015-09-23 DIAGNOSIS — W1839XD Other fall on same level, subsequent encounter: Secondary | ICD-10-CM | POA: Diagnosis not present

## 2015-09-23 DIAGNOSIS — S20211D Contusion of right front wall of thorax, subsequent encounter: Secondary | ICD-10-CM

## 2015-09-23 DIAGNOSIS — S29001D Unspecified injury of muscle and tendon of front wall of thorax, subsequent encounter: Secondary | ICD-10-CM | POA: Diagnosis present

## 2015-09-23 DIAGNOSIS — Z79899 Other long term (current) drug therapy: Secondary | ICD-10-CM | POA: Insufficient documentation

## 2015-09-23 NOTE — ED Notes (Signed)
States he fell about 2-3 days ago   Bruising noted to right lateral rib area states he is taking his percocet and ibu w/o relief

## 2015-09-23 NOTE — ED Notes (Signed)
Was seen at another local ER, DX with cracked ribs, after falling down 4 to 5 steps, unable to obtain pain control , was given rx PERCOCET, does not have PCP

## 2015-09-23 NOTE — ED Provider Notes (Signed)
CSN: [id number]     Arrival date & time 09/23/15  1305 History   First MD Initiated Contact with Patient 09/23/15 1339     Chief Complaint  Patient presents with  . Rib Injury    HPI Comments: 19 year old male with a history of marijuana abuse and chronic back pain presents today complaining of right lateral rib pain secondary to fall that occurred 3-4 days ago. He reports he has a right fractured rib and he ran out of the percocet that was prescribed when he was seen in [facility]. No cough, fevers or shortness of breath. Ribs hurt all the time. No relief with naproxen or tylenol.    The history is provided by the patient.    Past Medical History  Diagnosis Date  . Asthma   . Coughing up blood   . Kyphosis   . Chronic back pain   . Abscess   . Scoliosis    Past Surgical History  Procedure Laterality Date  . Liver cyst removal     Family History  Problem Relation Age of Onset  . Kyphosis Father   . Fibromyalgia Mother    Social History  Substance Use Topics  . Smoking status: Never Smoker   . Smokeless tobacco: Current User    Types: Chew  . Alcohol Use: No    Review of Systems  Cardiovascular: Negative for chest pain.  Musculoskeletal: Positive for myalgias and arthralgias.  All other systems reviewed and are negative.     Allergies  Bee venom; Tramadol; and Augmentin  Home Medications   Prior to Admission medications   Medication Sig Start Date End Date Taking? Authorizing Provider  ALBUTEROL SULFATE HFA IN Inhale into the lungs.    Historical Provider, MD  cetirizine (ZYRTEC) 10 MG tablet Take 10 mg by mouth daily.    Historical Provider, MD  cyclobenzaprine (FLEXERIL) 10 MG tablet Take 1 tablet (10 mg total) by mouth at bedtime. 02/26/15   Payton Mccallum, MD  cyclobenzaprine (FLEXERIL) 10 MG tablet Take 1 tablet (10 mg total) by mouth at bedtime. 02/26/15   Payton Mccallum, MD  FLUoxetine (PROZAC) 20 MG tablet Take 20 mg by mouth daily.    Historical Provider,  MD  gabapentin (NEURONTIN) 300 MG capsule Take 300 mg by mouth 3 (three) times daily.    Historical Provider, MD  ibuprofen (ADVIL,MOTRIN) 800 MG tablet Take 1 tablet (800 mg total) by mouth 3 (three) times daily. 02/26/15   Payton Mccallum, MD  omeprazole (PRILOSEC) 20 MG capsule Take 20 mg by mouth daily.    Historical Provider, MD  omeprazole (PRILOSEC) 20 MG capsule Take 1 capsule (20 mg total) by mouth daily. 02/17/15 03/20/15  Eustace Moore, MD  omeprazole (PRILOSEC) 40 MG capsule Take 40 mg by mouth daily.    Historical Provider, MD  oxyCODONE-acetaminophen (PERCOCET/ROXICET) 5-325 MG per tablet 1 tab po qd prn 02/26/15   Payton Mccallum, MD  pregabalin (LYRICA) 50 MG capsule Take 50 mg by mouth 3 (three) times daily.    Historical Provider, MD  tiZANidine (ZANAFLEX) 4 MG tablet Take 4 mg by mouth every 8 (eight) hours as needed for muscle spasms.    Historical Provider, MD   BP 145/104 mmHg  Pulse 79  Temp(Src) 97.8 F (36.6 C) (Oral)  Resp 18  Ht  (1.854 m)  Wt 102.059 kg  BMI 29.69 kg/m2  SpO2 99% Physical Exam  Constitutional: He is oriented to person, place, and time. He  appears well-developed and well-nourished.  HENT:  Head: Normocephalic and atraumatic.  Neck: Normal range of motion. Neck supple.  Cardiovascular: Normal rate, regular rhythm, normal heart sounds and intact distal pulses.   Pulmonary/Chest: Effort normal and breath sounds normal. No respiratory distress. He has no wheezes. He has no rales.   He exhibits tenderness.  Neurological: He is alert and oriented to person, place, and time.  Skin: Skin is warm and dry.  Psychiatric: He has a normal mood and affect. His behavior is normal. Judgment and thought content normal.  Nursing note and vitals reviewed.   ED Course  Procedures (including critical care time) Labs Review Labs Reviewed - No data to display  Imaging Review No results found. I have personally reviewed and evaluated these images and lab  results as part of my medical decision-making.   EKG Interpretation None      MDM  I reviewed patient's medical records and he had a NORMAL chest and right ribs XRAY [facility]. He was prescribed #10 percocet. He has a history of drug abuse and chronic pain. I explained to him we do not refill narcotics and they are not indicated for bruises. He became very angry when I told him I would not refill his pain medication and left.  Final diagnoses:  Rib contusion, right, subsequent encounter        Luvenia Reddenmma Weavil V, PA-C 09/23/15 1520  Sharman CheekPhillip Stafford, MD 09/25/15 [phone]

## 2015-11-20 ENCOUNTER — Ambulatory Visit
Admission: EM | Admit: 2015-11-20 | Discharge: 2015-11-20 | Disposition: A | Discharge status: Home / Self Care | Payer: Medicaid Other | Attending: Family Medicine | Admitting: Family Medicine

## 2015-11-20 ENCOUNTER — Ambulatory Visit: Payer: Medicaid Other

## 2015-11-20 DIAGNOSIS — W19XXXA Unspecified fall, initial encounter: Secondary | ICD-10-CM | POA: Insufficient documentation

## 2015-11-20 DIAGNOSIS — M5489 Other dorsalgia: Secondary | ICD-10-CM

## 2015-11-20 DIAGNOSIS — S99912A Unspecified injury of left ankle, initial encounter: Secondary | ICD-10-CM | POA: Diagnosis not present

## 2015-11-20 DIAGNOSIS — G8929 Other chronic pain: Secondary | ICD-10-CM | POA: Diagnosis not present

## 2015-11-20 DIAGNOSIS — M25572 Pain in left ankle and joints of left foot: Secondary | ICD-10-CM | POA: Diagnosis present

## 2015-11-20 DIAGNOSIS — M545 Low back pain: Secondary | ICD-10-CM | POA: Insufficient documentation

## 2015-11-20 MED ORDER — NAPROXEN 500 MG PO TABS: 500.0000 mg | ORAL_TABLET | Freq: Two times a day (BID) | ORAL | Status: DC

## 2015-11-20 NOTE — ED Notes (Signed)
Left ankle pain.  Fell and twisted it about 2 nights ago.  When fell also hurt left knee.  Then hurt back.  That has been "throbbing and hurting".  Reports chronic kyphosis and fibromyalgia.  Reports getting hairline fracture around 8 weeks ago.

## 2015-11-20 NOTE — ED Notes (Signed)
Patient asked Provider and this nurse for pain medications. Informed that cannot be given pain medication and that antiinflammatory sent to drug store by provider as requested. C/o Xray tech "twisting and hurting me more so my pain is a 9/10". Cam walker applied-states "this is the same as the one I have at home that I got in [place]". Crutches also given.Marland Kitchen To [facility] waiting room to find his Mother (also a patient) had left him here at [facility]. Telephone given for patient to call his Mother and tell her he was ready and waiting at the door. Mother arrived at 33hr. Patient angry and stating "I hope my Insurance company doesn't pay for this visit because my foot is broken and I have to go to the ER now. That doctor wouldn't give me pain medications.". Patient finally did exit the building. Dr. Concha Se accompanied to her car by security officer as she was fearful and felt threatened. Patient left with sister (also a patient) driving.

## 2015-11-20 NOTE — ED Provider Notes (Signed)
CSN: [id number]     Arrival date & time 11/20/15  2008 History   First MD Initiated Contact with Patient 11/20/15 2009     Chief Complaint  Patient presents with  . Ankle Pain   (Consider location/radiation/quality/duration/timing/severity/associated sxs/prior Treatment) HPI: Patient presents today with symptoms of left ankle pain and swelling. Patient states that he fell and twisted about 2 days ago. He injured his left ankle and fell on his back. He states that several weeks ago he was diagnosed with a hairline fracture in his left ankle. This was diagnosed in [place] according to the patient. He was put in a boot. He is not in a boot in the office today. He states that he reinjured the same ankle 2 days ago. He states that he injured his left knee as well but now has full range of motion and no swelling or pain. He states that he has chronic back pain and the pain is slightly worse than normal in the thoracic area. He states he has a history of scoliosis and kyphosis. He denies any chest pain or shortness of breath. He denies any calf swelling or pain.  Past Medical History  Diagnosis Date  . Asthma   . Coughing up blood   . Kyphosis   . Chronic back pain   . Abscess   . Scoliosis    Past Surgical History  Procedure Laterality Date  . Liver cyst removal    . Back surgery     Family History  Problem Relation Age of Onset  . Kyphosis Father   . Fibromyalgia Mother    Social History  Substance Use Topics  . Smoking status: Never Smoker   . Smokeless tobacco: Current User    Types: Chew  . Alcohol Use: No    Review of Systems: Negative except mentioned above.   Allergies  Bee venom; Tramadol; and Augmentin  Home Medications   Prior to Admission medications   Medication Sig Start Date End Date Taking? Authorizing Provider  ALBUTEROL SULFATE HFA IN Inhale into the lungs.    Historical Provider, MD  cetirizine (ZYRTEC) 10 MG tablet Take 10 mg by mouth daily.    Historical  Provider, MD  cyclobenzaprine (FLEXERIL) 10 MG tablet Take 1 tablet (10 mg total) by mouth at bedtime. 02/26/15   Payton Mccallum, MD  cyclobenzaprine (FLEXERIL) 10 MG tablet Take 1 tablet (10 mg total) by mouth at bedtime. 02/26/15   Payton Mccallum, MD  FLUoxetine (PROZAC) 20 MG tablet Take 20 mg by mouth daily.    Historical Provider, MD  gabapentin (NEURONTIN) 300 MG capsule Take 300 mg by mouth 3 (three) times daily.    Historical Provider, MD  ibuprofen (ADVIL,MOTRIN) 800 MG tablet Take 1 tablet (800 mg total) by mouth 3 (three) times daily. 02/26/15   Payton Mccallum, MD  omeprazole (PRILOSEC) 20 MG capsule Take 20 mg by mouth daily.    Historical Provider, MD  omeprazole (PRILOSEC) 20 MG capsule Take 1 capsule (20 mg total) by mouth daily. 02/17/15 03/20/15  Eustace Moore, MD  omeprazole (PRILOSEC) 40 MG capsule Take 40 mg by mouth daily.    Historical Provider, MD  oxyCODONE-acetaminophen (PERCOCET/ROXICET) 5-325 MG per tablet 1 tab po qd prn 02/26/15   Payton Mccallum, MD  pregabalin (LYRICA) 50 MG capsule Take 50 mg by mouth 3 (three) times daily.    Historical Provider, MD  tiZANidine (ZANAFLEX) 4 MG tablet Take 4 mg by mouth every 8 (eight) hours as needed  for muscle spasms.    Historical Provider, MD   Meds Ordered and Administered this Visit  Medications - No data to display  BP 138/76 mmHg  Pulse 97  Temp(Src) 98 F (36.7 C) (Oral)  Resp 16  SpO2 95% No data found.   Physical Exam   GENERAL: NAD RESP: CTA B CARD: RRR MSK: BACK: +Kyphosis and scoliosis, generalized thoracic tenderness, no ecchymosis or swelling appreciated, no cervical spine or lumbar spine tenderness, FROM, -SLR  L ANKLE: mild lateral swelling, lateral tenderness, decreased ROM in all directions, unable to do any further testing due to patient's discomfort, no foot tenderness, nv intact  NEURO: CN II-XII grossly intact   ED Course  Procedures (including critical care time)  Labs Review Labs Reviewed - No data  to display  Imaging Review No results found.    MDM   A/P: Chronic lower back pain-no acute findings noted on x-ray, I have asked that the patient use ice on the area as needed, can use anti-inflammatories such as Naprosyn when necessary. He can follow-up with [facility] regarding his back pain if it does persist or worsen.  Left ankle injury-unclear whether the injury from several weeks ago is what is resulted on the x-ray today, discussed findings on xray with patient, disc given to patient, I am putting him in a walking boot with crutches. He is to follow-up with orthopedics as soon as possible. RICE, Naprosyn and Tylenol discussed. If any acute problems he can be seen at the ER.   Jolene Provost, MD 11/20/15 2154

## 2015-11-20 NOTE — ED Notes (Signed)
Ambulatory to treatment room. Reporting left ankle and left knee pain.  Also, reporting back pain from the fall.

## 2015-11-20 NOTE — ED Notes (Signed)
Patient transported to X-ray 

## 2015-11-21 MED ORDER — OXYCODONE-ACETAMINOPHEN 5-325 MG PO TABS: 1.0000 | ORAL_TABLET | ORAL | Status: DC | PRN

## 2015-11-21 NOTE — ED Notes (Addendum)
[name] from previous night came back to [facility] stating he was supposed to pick up pain medicine. [name] had agreed to write a minimal prescription for pain medicine, however [name] was busy seeing other patients and stated he would get Ethan rx to [name] in [facility] next 15-20 minutes. When this nurse went to Ethan check in counter and relayed that message [name] became very agitated and said he wanted to speak to Ethan nurse/clinical manager. This nurse said I am Ethan clinical manager and I'm trying to help you.  [name] will get Ethan prescription written as soon as he possibly can. At this time [facility] patients wife came in carrying a child whom [name] identified as [name], also a [name] seen here last night.(see note from [name], RN) Both [name] and Ethan wife were openly aggressive towards this nurse so much so that Ethan security officer came and stood beside this nurse. On at least one occasion [name] openly threatened to sue Ethan facility and Ethan physican he saw Ethan night before.  Once Ethan prescription was given to [name] he, Ethan wife and little [name] left [facility] facility.  NW

## 2016-04-06 ENCOUNTER — Emergency Department
Admission: EM | Admit: 2016-04-06 | Discharge: 2016-04-06 | Disposition: A | Discharge status: Home / Self Care | Payer: Medicaid Other | Attending: Emergency Medicine | Admitting: Emergency Medicine

## 2016-04-06 DIAGNOSIS — T7840XA Allergy, unspecified, initial encounter: Secondary | ICD-10-CM | POA: Insufficient documentation

## 2016-04-06 DIAGNOSIS — Z79899 Other long term (current) drug therapy: Secondary | ICD-10-CM | POA: Insufficient documentation

## 2016-04-06 DIAGNOSIS — F17210 Nicotine dependence, cigarettes, uncomplicated: Secondary | ICD-10-CM | POA: Insufficient documentation

## 2016-04-06 DIAGNOSIS — Z791 Long term (current) use of non-steroidal anti-inflammatories (NSAID): Secondary | ICD-10-CM | POA: Insufficient documentation

## 2016-04-06 DIAGNOSIS — J45909 Unspecified asthma, uncomplicated: Secondary | ICD-10-CM | POA: Insufficient documentation

## 2016-04-06 MED ORDER — METHYLPREDNISOLONE SODIUM SUCC 125 MG IJ SOLR
125.0000 mg | Freq: Once | INTRAMUSCULAR | Status: AC
  Administered 2016-04-06: 125 mg via INTRAVENOUS
  Filled 2016-04-06: qty 2

## 2016-04-06 MED ORDER — SODIUM CHLORIDE 0.9 % IV SOLN
1000.0000 mL | Freq: Once | INTRAVENOUS | Status: AC
  Administered 2016-04-06: 1000 mL via INTRAVENOUS

## 2016-04-06 MED ORDER — NAPROXEN 500 MG PO TABS: 500.0000 mg | ORAL_TABLET | Freq: Two times a day (BID) | ORAL | Status: DC

## 2016-04-06 MED ORDER — EPINEPHRINE 0.3 MG/0.3ML IJ SOAJ
0.3000 mg | Freq: Once | INTRAMUSCULAR | Status: AC
Start: 2016-04-06 — End: 2016-04-06
  Administered 2016-04-06: 0.3 mg via INTRAMUSCULAR
  Filled 2016-04-06: qty 0.3

## 2016-04-06 MED ORDER — PREDNISONE 50 MG PO TABS: 50.0000 mg | ORAL_TABLET | Freq: Every day | ORAL | Status: DC

## 2016-04-06 MED ORDER — FAMOTIDINE IN NACL 20-0.9 MG/50ML-% IV SOLN
20.0000 mg | Freq: Once | INTRAVENOUS | Status: AC
  Administered 2016-04-06: 20 mg via INTRAVENOUS
  Filled 2016-04-06: qty 50

## 2016-04-06 NOTE — ED Notes (Signed)
Pt reports that he was stung by a wasp to the rt forearm area about 30 min ago. 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 Pt reports he feel sob. No distress noted, no obvious swelling/redness noted to area. Pt also that he "cracked" his ribs on the left side last Saturday and he is also having pain there too.

## 2016-04-06 NOTE — ED Notes (Signed)
Pt verbalized he no longer wants to file workers comp after he was told he needed to provide a urine drug screen. Pt became upset and pulled his own IV out and reported he wanted to leave at this time. Pt informed that if he left ED he would not be able to file workers comp at a future date. Pt verbalized understanding and stated "my mother was a Engineer, civil (consulting)nurse and is now a PA and I know how all of this works, I want to work and I don't need a handout" Pt refused a recheck of vitals and insisted he wanted to leave. Pt able to ambulate independently upon d/c and was in NAD at time of discharge.

## 2016-04-06 NOTE — ED Notes (Signed)
Pt states he was stung by a wasp 20min PTA.. States he is allergic and does not have an epi pen.. Pt c/o throat swelling and SOB.. No apparent rash on arrival..   Pt states he took benadryl 25mg  x3 PTA

## 2016-04-06 NOTE — Discharge Instructions (Signed)
Epinephrine Injection °Epinephrine is a medicine given by injection to temporarily treat an emergency allergic reaction. It is also used to treat severe asthmatic attacks and other lung problems. The medicine helps to enlarge (dilate) the small breathing tubes of the lungs. A life-threatening, sudden allergic reaction that involves the whole body is called anaphylaxis. Because of potential side effects, epinephrine should only be used as directed by your caregiver. °RISKS AND COMPLICATIONS °Possible side effects of epinephrine injections include: °· Chest pain. °· Irregular or rapid heartbeat. °· Shortness of breath. °· Nausea. °· Vomiting. °· Abdominal pain or cramping. °· Sweating. °· Dizziness. °· Weakness. °· Headache. °· Nervousness. °Report all side effects to your caregiver. °HOW TO GIVE AN EPINEPHRINE INJECTION °Give the epinephrine injection immediately when symptoms of a severe reaction begin. Inject the medicine into the outer thigh or any available, large muscle. Your caregiver can teach you how to do this. You do not need to remove any clothing. After the injection, call your local emergency services (911 in [place] Even if you improve after the injection, you need to be examined at a hospital emergency department. Epinephrine works quickly, but it also wears off quickly. Delayed reactions can occur. A delayed reaction may be as serious and dangerous as the initial reaction. °HOME CARE INSTRUCTIONS °· Make sure you and your family know how to give an epinephrine injection. °· Use epinephrine injections as directed by your caregiver. Do not use this medicine more often or in larger doses than prescribed. °· Always carry your epinephrine injection or anaphylaxis kit with you. This can be lifesaving if you have a severe reaction. °· Store the medicine in a cool, dry place. If the medicine becomes discolored or cloudy, dispose of it properly and replace it with new medicine. °· Check the expiration date on  your medicine. It may be unsafe to use medicines past their expiration date. °· Tell your caregiver about any other medicines you are taking. Some medicines can react badly with epinephrine. °· Tell your caregiver about any medical conditions you have, such as diabetes, high blood pressure (hypertension), heart disease, irregular heartbeats, or if you are pregnant. °SEEK IMMEDIATE MEDICAL CARE IF: °· You have used an epinephrine injection. Call your local emergency services (911 in [place] Even if you improve after the injection, you need to be examined at a hospital emergency department to make sure your allergic reaction is under control. You will also be monitored for adverse effects from the medicine. °· You have chest pain. °· You have irregular or fast heartbeats. °· You have shortness of breath. °· You have severe headaches. °· You have severe nausea, vomiting, or abdominal cramps. °· You have severe pain, swelling, or redness in the area where you gave the injection. °  °This information is not intended to replace advice given to you by your health care provider. Make sure you discuss any questions you have with your health care provider. °  °Document Released: 09/24/2000 Document Revised: 12/20/2011 Document Reviewed: 04/16/2015 °Elsevier Interactive Patient Education ©2016 [employer] ° °

## 2016-04-06 NOTE — ED Notes (Signed)
Small amount of swelling noted to pts right forearm, no rash or swelling noted on the rest of pts body. Pt is having no increased WOB and is able to talk with no changes in voice noted. Pt reports most of his pain is coming from left rib cage where he is recovering from a previous rib injury.

## 2016-04-06 NOTE — ED Provider Notes (Signed)
[facility] Emergency Department Provider Note  ____________________________________________    I have reviewed the triage vital signs and the nursing notes.   HISTORY  Chief Complaint Allergic Reaction    HPI [name] is a 20 y.o. male who presents with complaints of allergic reaction. He notes he was stung by a wasp about 30 minutes prior to arrival. He feels that his throat is swelling and feels sob although he notes he has 'cracked ribs' from prior accident. 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 Asthma   . Coughing up blood   . Kyphosis   . Chronic back pain   . Abscess   . Scoliosis     There are no active problems to display for this patient.   Past Surgical History  Procedure Laterality Date  . Liver cyst removal    . Back surgery      Current Outpatient Rx  Name  Route  Sig  Dispense  Refill  . ALBUTEROL SULFATE HFA IN   Inhalation   Inhale into the lungs.         . cetirizine (ZYRTEC) 10 MG tablet   Oral   Take 10 mg by mouth daily.         . cyclobenzaprine (FLEXERIL) 10 MG tablet   Oral   Take 1 tablet (10 mg total) by mouth at bedtime.   15 tablet   0   . cyclobenzaprine (FLEXERIL) 10 MG tablet   Oral   Take 1 tablet (10 mg total) by mouth at bedtime.   15 tablet   0   . FLUoxetine (PROZAC) 20 MG tablet   Oral   Take 20 mg by mouth daily.         Marland Kitchen. gabapentin (NEURONTIN) 300 MG capsule   Oral   Take 300 mg by mouth 3 (three) times daily.         Marland Kitchen. ibuprofen (ADVIL,MOTRIN) 800 MG tablet   Oral   Take 1 tablet (800 mg total) by mouth 3 (three) times daily.   30 tablet   0   . naproxen (NAPROSYN) 500 MG tablet   Oral   Take 1 tablet (500 mg total) by mouth 2 (two) times daily.   20 tablet   0   . omeprazole (PRILOSEC) 20 MG capsule   Oral   Take 20 mg by mouth daily.         Marland Kitchen. EXPIRED: omeprazole (PRILOSEC) 20 MG capsule   Oral    Take 1 capsule (20 mg total) by mouth daily.   30 capsule   0   . omeprazole (PRILOSEC) 40 MG capsule   Oral   Take 40 mg by mouth daily.         Marland Kitchen. oxyCODONE-acetaminophen (PERCOCET/ROXICET) 5-325 MG tablet   Oral   Take 1 tablet by mouth every 4 (four) hours as needed for severe pain.   5 tablet   0   . pregabalin (LYRICA) 50 MG capsule   Oral   Take 50 mg by mouth 3 (three) times daily.         Marland Kitchen. tiZANidine (ZANAFLEX) 4 MG tablet   Oral   Take 4 mg by mouth every 8 (eight) hours as needed for muscle spasms.           Allergies Bee venom; Tramadol; and Augmentin  Family History  Problem Relation Age of Onset  .  Kyphosis Father   . Fibromyalgia Mother     Social History Social History  Substance Use Topics  . Smoking status: Current Every Day Smoker    Types: Cigarettes  . Smokeless tobacco: Current User    Types: Chew  . Alcohol Use: No    Review of Systems  Constitutional: Negative for fever. Eyes: Negative for redness ENT: positive for throat swelling Cardiovascular: Negative for chest pain Respiratory: mild sob Gastrointestinal: Negative for abdominal pain  Musculoskeletal: Negative for back pain. Skin: Negative for rash. Neurological: Negative for focal weakness Psychiatric: no anxiety    ____________________________________________   PHYSICAL EXAM:  VITAL SIGNS: ED Triage Vitals  Enc Vitals Group     BP 04/06/16 1003 151/81 mmHg     Pulse Rate 04/06/16 1003 90     Resp 04/06/16 1003 18     Temp 04/06/16 1003 98.1 F (36.7 C)     Temp Source 04/06/16 1003 Oral     SpO2 04/06/16 1003 99 %     Weight 04/06/16 1003 210 lb (95.255 kg)     Height 04/06/16 1003  (1.93 m)     Head Cir --      Peak Flow --      Pain Score 04/06/16 1004 8     Pain Loc --      Pain Edu? --      Excl. in GC? --    Constitutional: Alert and oriented. Well appearing and in no distress.  Eyes: Conjunctivae are normal. No erythema or  injection ENT   Head: Normocephalic and atraumatic.   Mouth/Throat: Mucous membranes are moist. Pharynx is normal Cardiovascular:tachycardia, regular rhythm. Normal and symmetric distal pulses are present in the upper extremities. Respiratory: Normal respiratory effort without tachypnea nor retractions. Breath sounds are clear and equal bilaterally.  Gastrointestinal: Soft and non-tender in all quadrants.  Genitourinary: deferred Musculoskeletal: Nontender with normal range of motion in all extremities. Right arm redness Neurologic:  Normal speech and language. No gross focal neurologic deficits are appreciated. Skin:  Skin is warm, dry and intact. No rash noted. Psychiatric: Mood and affect are normal. Patient exhibits appropriate insight and judgment.  ____________________________________________    LABS (pertinent positives/negatives)  Labs Reviewed - No data to display  ____________________________________________   EKG  none3  ____________________________________________    RADIOLOGY    ____________________________________________   PROCEDURES  Procedure(s) performed: none  Critical Care performed: yes  CRITICAL CARE Performed by: Jene Every   Total critical care time: 30 minutes  Critical care time was exclusive of separately billable procedures and treating other patients.  Critical care was necessary to treat or prevent imminent or life-threatening deterioration.  Critical care was time spent personally by me on the following activities: development of treatment plan with patient and/or surrogate as well as nursing, discussions with consultants, evaluation of patient's response to treatment, examination of patient, obtaining history from patient or surrogate, ordering and performing treatments and interventions, ordering and review of laboratory studies, ordering and review of radiographic studies, pulse oximetry and re-evaluation of patient's  condition.   ____________________________________________   INITIAL IMPRESSION / ASSESSMENT AND PLAN / ED COURSE  Pertinent labs & imaging results that were available during my care of the patient were reviewed by me and considered in my medical decision making (see chart for details).  Patient p/w wasp sting, arm swelling, sensation of throat swelling and mild SOB. We will treat with epi 0.3 mg IM and solumedrol 125 mg, pepcid IV  for presumed anaphylaxis. He took benadryl PTA  ----------------------------------------- 12:38 PM on 04/06/2016 -----------------------------------------  Patient feeling much better. Asymptomatic at this point. Appropriate for discharge. Return precautions discussed  ____________________________________________   FINAL CLINICAL IMPRESSION(S) / ED DIAGNOSES  Final diagnoses:  Allergic reaction, initial encounter          Jene Everyobert Essam Lowdermilk, MD 04/06/16 1239

## 2017-03-21 ENCOUNTER — Encounter: Payer: Self-pay | Admitting: Emergency Medicine

## 2017-03-21 ENCOUNTER — Emergency Department
Admission: EM | Admit: 2017-03-21 | Discharge: 2017-03-21 | Disposition: A | Discharge status: Home / Self Care | Payer: Medicaid Other | Attending: Emergency Medicine | Admitting: Emergency Medicine

## 2017-03-21 DIAGNOSIS — Z7982 Long term (current) use of aspirin: Secondary | ICD-10-CM | POA: Insufficient documentation

## 2017-03-21 DIAGNOSIS — J45909 Unspecified asthma, uncomplicated: Secondary | ICD-10-CM | POA: Diagnosis not present

## 2017-03-21 DIAGNOSIS — Z791 Long term (current) use of non-steroidal anti-inflammatories (NSAID): Secondary | ICD-10-CM | POA: Insufficient documentation

## 2017-03-21 DIAGNOSIS — I10 Essential (primary) hypertension: Secondary | ICD-10-CM | POA: Diagnosis not present

## 2017-03-21 DIAGNOSIS — H1033 Unspecified acute conjunctivitis, bilateral: Secondary | ICD-10-CM

## 2017-03-21 DIAGNOSIS — Z79899 Other long term (current) drug therapy: Secondary | ICD-10-CM | POA: Insufficient documentation

## 2017-03-21 DIAGNOSIS — F17210 Nicotine dependence, cigarettes, uncomplicated: Secondary | ICD-10-CM | POA: Diagnosis not present

## 2017-03-21 DIAGNOSIS — H10021 Other mucopurulent conjunctivitis, right eye: Secondary | ICD-10-CM | POA: Diagnosis present

## 2017-03-21 MED ORDER — CIPROFLOXACIN HCL 0.3 % OP SOLN
2.0000 [drp] | OPHTHALMIC | Status: DC
  Administered 2017-03-21: 2 [drp] via OPHTHALMIC

## 2017-03-21 MED ORDER — CIPROFLOXACIN HCL 0.3 % OP SOLN
OPHTHALMIC | Status: AC
  Filled 2017-03-21: qty 2.5

## 2017-03-21 MED ORDER — CIPROFLOXACIN HCL 0.3 % OP SOLN: 1.0000 [drp] | OPHTHALMIC | 0 refills | Status: AC

## 2017-03-21 MED ORDER — NAPROXEN 500 MG PO TABS: 500.0000 mg | ORAL_TABLET | Freq: Once | ORAL | Status: DC

## 2017-03-21 NOTE — ED Notes (Signed)
See triage note..the patient reports to ED w/ c/o pain/inflamation to R eye. Pt sts gradually worsening x 3 days, denies injury to eye. Pt reports L eye starting to be painful. Pt reports blurry vision in R eye. R eye noticeably red and swollen. Pt sts daughter having same s/s at home.

## 2017-03-21 NOTE — ED Triage Notes (Signed)
Pt to triage per EMS from home with c/o right eye swelling, drainage (green) and pain x3 days. Pt unable to see clearly from right eye.

## 2017-03-21 NOTE — ED Provider Notes (Signed)
[facility] Emergency Department Provider Note ____________________________________________  Time seen: Approximately 10:50 PM  I have reviewed the triage vital signs and the nursing notes.   HISTORY  Chief Complaint Eye Drainage   HPI [name] is a 21 y.o. male who presents to the emergency department for evaluation of right eye irritation and drainage that started about 3:00pm today. No relief with ibuprofen and heat. He has had a recent bronchitis. Feeling of swelling and photophobia. He also believes symptoms are now starting in the left eye as well.  Past Medical History:  Diagnosis Date  . Abscess   . Asthma   . Chronic back pain   . Coughing up blood   . Kyphosis   . Scoliosis     There are no active problems to display for this patient.   Past Surgical History:  Procedure Laterality Date  . BACK SURGERY    . LIVER CYST REMOVAL      Prior to Admission medications   Medication Sig Start Date End Date Taking? Authorizing Provider  ALBUTEROL SULFATE HFA IN Inhale into the lungs.    [provider]  cetirizine (ZYRTEC) 10 MG tablet Take 10 mg by mouth daily.    [provider]  ciprofloxacin (CILOXAN) 0.3 % ophthalmic solution Place 1 drop into both eyes every 2 (two) hours. Administer 1 drop, every 2 hours, while awake, for 2 days. Then 1 drop, every 4 hours, while awake, for the next 5 days. 03/21/17 03/26/17  Shyam Dawson, Kasandra Knudsen, FNP  cyclobenzaprine (FLEXERIL) 10 MG tablet Take 1 tablet (10 mg total) by mouth at bedtime. 02/26/15   Payton Mccallum, MD  FLUoxetine (PROZAC) 20 MG tablet Take 20 mg by mouth daily.    [provider]  gabapentin (NEURONTIN) 300 MG capsule Take 300 mg by mouth 3 (three) times daily.    [provider]  ibuprofen (ADVIL,MOTRIN) 800 MG tablet Take 1 tablet (800 mg total) by mouth 3 (three) times daily. 02/26/15   Payton Mccallum, MD  naproxen (NAPROSYN) 500 MG tablet Take 1 tablet  (500 mg total) by mouth 2 (two) times daily with a meal. 04/06/16   Jene Every, MD  omeprazole (PRILOSEC) 20 MG capsule Take 1 capsule (20 mg total) by mouth daily. 02/17/15 03/20/15  Eustace Moore, MD  omeprazole (PRILOSEC) 40 MG capsule Take 40 mg by mouth daily.    [provider]  oxyCODONE-acetaminophen (PERCOCET/ROXICET) 5-325 MG tablet Take 1 tablet by mouth every 4 (four) hours as needed for severe pain. 11/21/15   Lutricia Feil, PA-C  predniSONE (DELTASONE) 50 MG tablet Take 1 tablet (50 mg total) by mouth daily with breakfast. 04/06/16   Jene Every, MD  pregabalin (LYRICA) 50 MG capsule Take 50 mg by mouth 3 (three) times daily.    [provider]  tiZANidine (ZANAFLEX) 4 MG tablet Take 4 mg by mouth every 8 (eight) hours as needed for muscle spasms.    [provider]    Allergies Bee venom; Tramadol; and Augmentin [amoxicillin-pot clavulanate]  Family History  Problem Relation Age of Onset  . Fibromyalgia Mother   . Kyphosis Father     Social History Social History  Substance Use Topics  . Smoking status: Current Every Day Smoker    Types: Cigarettes  . Smokeless tobacco: Current User    Types: Chew  . Alcohol use No    Review of Systems   Constitutional: No fever/chills Eyes: Positive for visual changes in  the right eye. Positive for pain. Musculoskeletal: Negative for pain. Skin: Negative for rash. Neurological: Negative for headaches, focal weakness or numbness. Allergic: Negative for seasonal allergies. ____________________________________________  PHYSICAL EXAM:  VITAL SIGNS: ED Triage Vitals  Enc Vitals Group     BP 03/21/17 2154 (!) 167/100     Pulse Rate 03/21/17 2154 (!) 107     Resp 03/21/17 2154 18     Temp 03/21/17 2154 98.9 F (37.2 C)     Temp Source 03/21/17 2154 Oral     SpO2 03/21/17 2154 97 %     Weight 03/21/17 2148 210 lb (95.3 kg)     Height --      Head Circumference --      Peak Flow --       Pain Score 03/21/17 2148 9     Pain Loc --      Pain Edu? --      Excl. in GC? --     Constitutional: Alert and oriented. Well appearing and in no acute distress. Eyes: Visual acuity--see nursing documentation; no globe trauma; Eyelids mildly erythematous and edematous; Sclera appears anicteric.  Eyelids not inverted. Conjunctiva appears erythematous but does not reach the limbus; Cornea clear on unstained exam. Head: Atraumatic. Nose: No congestion/rhinnorhea. Mouth/Throat: Mucous membranes are moist.  Oropharynx non-erythematous. Respiratory: Even and unlabored. Breath sounds clear to auscultation. Musculoskeletal:Normal ROM x 4 extremities. Neurologic:  Normal speech and language. No gross focal neurologic deficits are appreciated. Speech is normal. No gait instability. Skin:  Skin is warm, dry and intact. No rash noted. Psychiatric: Mood and affect are normal. Speech and behavior are normal.  ____________________________________________   LABS (all labs ordered are listed, but only abnormal results are displayed)  Labs Reviewed - No data to display ____________________________________________  EKG  Not indicated. ____________________________________________  RADIOLOGY  Not indicated. ____________________________________________   PROCEDURES  Procedure(s) performed: None ____________________________________________   INITIAL IMPRESSION / ASSESSMENT AND PLAN / ED COURSE  21 year old male presenting to the emergency department for evaluation of eye irritation. He is also noted to be hypertensive, but is asymptomatic at this time.  He will be treated with ciprofloxacin eye drops and advised to follow up with the PCP of his choice for blood pressure recheck and monitoring. He was advised to return to the ER for symptoms that change or worsen if unable to schedule an appointment.  Pertinent labs & imaging results that were available during my care of the patient were  reviewed by me and considered in my medical decision making (see chart for details). ____________________________________________   FINAL CLINICAL IMPRESSION(S) / ED DIAGNOSES  Final diagnoses:  Acute conjunctivitis of both eyes, unspecified acute conjunctivitis type  Hypertension, unspecified type    Note:  This document was prepared using Dragon voice recognition software and may include unintentional dictation errors.    Chinita Pesterriplett, Clorinda Wyble B, FNP 03/26/17 [id number]    Myrna BlazerSchaevitz, David Matthew, MD 03/26/17 1416

## 2018-06-14 ENCOUNTER — Ambulatory Visit
Admission: EM | Admit: 2018-06-14 | Discharge: 2018-06-14 | Disposition: A | Discharge status: Home / Self Care | Payer: Medicaid Other | Attending: Family Medicine | Admitting: Family Medicine

## 2018-06-14 ENCOUNTER — Other Ambulatory Visit: Payer: Self-pay

## 2018-06-14 DIAGNOSIS — J0100 Acute maxillary sinusitis, unspecified: Secondary | ICD-10-CM | POA: Diagnosis not present

## 2018-06-14 DIAGNOSIS — L03116 Cellulitis of left lower limb: Secondary | ICD-10-CM

## 2018-06-14 DIAGNOSIS — L0390 Cellulitis, unspecified: Secondary | ICD-10-CM

## 2018-06-14 DIAGNOSIS — R05 Cough: Secondary | ICD-10-CM

## 2018-06-14 DIAGNOSIS — S30861A Insect bite (nonvenomous) of abdominal wall, initial encounter: Secondary | ICD-10-CM

## 2018-06-14 DIAGNOSIS — S80862A Insect bite (nonvenomous), left lower leg, initial encounter: Secondary | ICD-10-CM

## 2018-06-14 DIAGNOSIS — R059 Cough, unspecified: Secondary | ICD-10-CM

## 2018-06-14 MED ORDER — DOXYCYCLINE HYCLATE 100 MG PO CAPS: 100.0000 mg | ORAL_CAPSULE | Freq: Two times a day (BID) | ORAL | 0 refills | Status: DC

## 2018-06-14 MED ORDER — BENZONATATE 100 MG PO CAPS: 100.0000 mg | ORAL_CAPSULE | Freq: Three times a day (TID) | ORAL | 0 refills | Status: DC | PRN

## 2018-06-14 MED ORDER — MUPIROCIN 2 % EX OINT: TOPICAL_OINTMENT | CUTANEOUS | 0 refills | Status: DC

## 2018-06-14 NOTE — ED Triage Notes (Signed)
Patient has multiple insect bites on body that he noticed over last few days. One on the side of left leg and one on abdomen.   Patient complains of cough, hoarseness x 1 week.

## 2018-06-14 NOTE — Discharge Instructions (Addendum)
Take medication as prescribed. Rest. Drink plenty of fluids. Avoid scratching.   Follow up with your primary care physician this week as needed. Return to Urgent care for new or worsening concerns.   

## 2018-06-14 NOTE — ED Provider Notes (Signed)
[facility] ____________________________________________  Time seen: Approximately 8:01 PM  I have reviewed the triage vital signs and the nursing notes.   HISTORY  Chief Complaint Insect Bite and Cough  HPI [name] is a 22 y.o. male presenting for evaluation of 10 days of runny nose, nasal congestion, cough, sinus pressure, as well as insect bites.  Patient reports initially thought he just had a cold, reports close in household with similar just prior to his symptom onset.  States initially had some sore throat, sore throat is since resolved and only present with coughing.  Some sinus pressure with thick nasal drainage.  Also coughing up greenish phlegm.  States that he did take leftover azithromycin at early onset of sickness which did not do anything.  States he is also used some leftover Lawyer which did help some.  Denies hemoptysis.  Denies known fevers.  Has continued to eat and drink well.  Also continues to remain active.  Reports he also had either a spider bite or insect bite to left calf and abdomen sometime the last week.  States that he has been scratching at it as it was itchy.  States the area is now red and concern for possible infection.  Denies recurrent skin infections.  Tetanus immunization last in 2016.  Denies other aggravating or alleviating factors.  Reports otherwise feels well.  Denies chest pain, shortness of breath, abdominal pain, dysuria.   Past Medical History:  Diagnosis Date  . Abscess   . Asthma   . Chronic back pain   . Coughing up blood   . Kyphosis   . Scoliosis     There are no active problems to display for this patient.   Past Surgical History:  Procedure Laterality Date  . BACK SURGERY    . LIVER CYST REMOVAL       No current facility-administered medications for this encounter.   Current Outpatient Medications:  .  ALBUTEROL SULFATE HFA IN, Inhale into the lungs., Disp: , Rfl:  .  gabapentin (NEURONTIN)  300 MG capsule, Take 300 mg by mouth 3 (three) times daily., Disp: , Rfl:  .  ibuprofen (ADVIL,MOTRIN) 800 MG tablet, Take 1 tablet (800 mg total) by mouth 3 (three) times daily., Disp: 30 tablet, Rfl: 0 .  omeprazole (PRILOSEC) 40 MG capsule, Take 40 mg by mouth daily., Disp: , Rfl:  .  pregabalin (LYRICA) 50 MG capsule, Take 50 mg by mouth 3 (three) times daily., Disp: , Rfl:  .  tiZANidine (ZANAFLEX) 4 MG tablet, Take 4 mg by mouth every 8 (eight) hours as needed for muscle spasms., Disp: , Rfl:  .  benzonatate (TESSALON PERLES) 100 MG capsule, Take 1 capsule (100 mg total) by mouth 3 (three) times daily as needed for cough., Disp: 15 capsule, Rfl: 0 .  doxycycline (VIBRAMYCIN) 100 MG capsule, Take 1 capsule (100 mg total) by mouth 2 (two) times daily., Disp: 20 capsule, Rfl: 0 .  mupirocin ointment (BACTROBAN) 2 %, Apply two times a day for 7 days., Disp: 22 g, Rfl: 0 .  omeprazole (PRILOSEC) 20 MG capsule, Take 1 capsule (20 mg total) by mouth daily., Disp: 30 capsule, Rfl: 0  Allergies Bee venom; Tramadol; and Augmentin [amoxicillin-pot clavulanate]  Family History  Problem Relation Age of Onset  . Fibromyalgia Mother   . Kyphosis Father     Social History Social History   Tobacco Use  . Smoking status: Current Every Day Smoker    Packs/day: 0.50  Types: Cigarettes  . Smokeless tobacco: Never Used  Substance Use Topics  . Alcohol use: No    Alcohol/week: 0.0 standard drinks  . Drug use: Yes    Types: Marijuana    Review of Systems Constitutional: No fever/chills ENT: as above. Cardiovascular: Denies chest pain. Respiratory: Denies shortness of breath. Gastrointestinal: No abdominal pain.  No nausea, no vomiting.  No diarrhea.    Musculoskeletal: Negative for back pain. Skin:as above.   ____________________________________________   PHYSICAL EXAM:  VITAL SIGNS: ED Triage Vitals  Enc Vitals Group     BP 06/14/18 1941 (!) 141/84     Pulse Rate 06/14/18 1941 89      Resp 06/14/18 1941 17     Temp 06/14/18 1941 98 F (36.7 C)     Temp Source 06/14/18 1941 Oral     SpO2 06/14/18 1941 98 %     Weight 06/14/18 1938 230 lb (104.3 kg)     Height 06/14/18 1938 6\' 1"  (1.854 m)     Head Circumference --      Peak Flow --      Pain Score 06/14/18 1938 4     Pain Loc --      Pain Edu? --      Excl. in GC? --     Constitutional: Alert and oriented. Well appearing and in no acute distress. Eyes: Conjunctivae are normal. PERRL. EOMI. Head: [employer].fr maxillary sinusitis palpation.  No frontal sinus tenderness to palpation.  No swelling. No erythema.  Ears: no erythema, normal TMs bilaterally.   Nose:Nasal congestion with clear rhinorrhea  Mouth/Throat: Mucous membranes are moist. Mild pharyngeal erythema. No tonsillar swelling or exudate.  Neck: No stridor.  No cervical spine tenderness to palpation. Hematological/Lymphatic/Immunilogical: No cervical lymphadenopathy. Cardiovascular: Normal rate, regular rhythm. Grossly normal heart sounds.  Good peripheral circulation. Respiratory: Normal respiratory effort.  No retractions.  No wheezes or rales.  Mild scattered rhonchi.  Good air movement.  Musculoskeletal: Ambulatory with steady gait. No cervical, thoracic or lumbar tenderness to palpation. Neurologic:  Normal speech and language. No gait instability. Skin:  Skin appears warm, dry.  Except: Left lower lateral calf and left abdomen approximately 2 cm appraised area with mild induration and mild immediate surrounding erythema, no fluctuance, no active drainage. Psychiatric: Mood and affect are normal. Speech and behavior are normal.  ___________________________________________   LABS (all labs ordered are listed, but only abnormal results are displayed)  Labs Reviewed - No data to display   PROCEDURES Procedures   INITIAL IMPRESSION / ASSESSMENT AND PLAN / ED COURSE  Pertinent labs & imaging results that were available during my care of  the patient were reviewed by me and considered in my medical decision making (see chart for details).  Well-appearing patient.  No acute distress.  Recent cough and congestion, suspect secondary maxillary sinusitis with continued cough.  Also recent insect bite with local cellulitis.  Will treat with oral doxycycline, PRN Tessalon Perles and topical Bactroban.  Encourage rest, fluids, supportive care.  Work note given for today.Discussed indication, risks and benefits of medications with patient.  Discussed follow up with Primary care physician this week. Discussed follow up and return parameters including no resolution or any worsening concerns. Patient verbalized understanding and agreed to plan.   ____________________________________________   FINAL CLINICAL IMPRESSION(S) / ED DIAGNOSES  Final diagnoses:  Acute maxillary sinusitis, recurrence not specified  Cough  Cellulitis, unspecified cellulitis site     ED Discharge Orders  Ordered    doxycycline (VIBRAMYCIN) 100 MG capsule  2 times daily     06/14/18 1957    benzonatate (TESSALON PERLES) 100 MG capsule  3 times daily PRN     06/14/18 1957    mupirocin ointment (BACTROBAN) 2 %     06/14/18 1959           Note: This dictation was prepared with Dragon dictation along with smaller phrase technology. Any transcriptional errors that result from this process are unintentional.         Renford Dills, NP 06/14/18 2046

## 2018-07-24 ENCOUNTER — Encounter: Payer: Self-pay | Admitting: Emergency Medicine

## 2018-07-24 ENCOUNTER — Other Ambulatory Visit: Payer: Self-pay

## 2018-07-24 ENCOUNTER — Ambulatory Visit: Payer: Medicaid Other

## 2018-07-24 ENCOUNTER — Ambulatory Visit
Admission: EM | Admit: 2018-07-24 | Discharge: 2018-07-24 | Disposition: A | Discharge status: Home / Self Care | Payer: Medicaid Other | Attending: Family Medicine | Admitting: Family Medicine

## 2018-07-24 DIAGNOSIS — Z79899 Other long term (current) drug therapy: Secondary | ICD-10-CM | POA: Diagnosis not present

## 2018-07-24 DIAGNOSIS — W34010A Accidental discharge of airgun, initial encounter: Secondary | ICD-10-CM | POA: Insufficient documentation

## 2018-07-24 DIAGNOSIS — M419 Scoliosis, unspecified: Secondary | ICD-10-CM | POA: Insufficient documentation

## 2018-07-24 DIAGNOSIS — G8929 Other chronic pain: Secondary | ICD-10-CM | POA: Diagnosis not present

## 2018-07-24 DIAGNOSIS — Z791 Long term (current) use of non-steroidal anti-inflammatories (NSAID): Secondary | ICD-10-CM | POA: Insufficient documentation

## 2018-07-24 DIAGNOSIS — S60450A Superficial foreign body of right index finger, initial encounter: Secondary | ICD-10-CM

## 2018-07-24 DIAGNOSIS — Z91018 Allergy to other foods: Secondary | ICD-10-CM | POA: Insufficient documentation

## 2018-07-24 DIAGNOSIS — J45909 Unspecified asthma, uncomplicated: Secondary | ICD-10-CM | POA: Insufficient documentation

## 2018-07-24 DIAGNOSIS — S60459A Superficial foreign body of unspecified finger, initial encounter: Secondary | ICD-10-CM

## 2018-07-24 DIAGNOSIS — F17210 Nicotine dependence, cigarettes, uncomplicated: Secondary | ICD-10-CM | POA: Insufficient documentation

## 2018-07-24 DIAGNOSIS — Z880 Allergy status to penicillin: Secondary | ICD-10-CM | POA: Insufficient documentation

## 2018-07-24 DIAGNOSIS — M795 Residual foreign body in soft tissue: Secondary | ICD-10-CM | POA: Insufficient documentation

## 2018-07-24 DIAGNOSIS — Z91030 Bee allergy status: Secondary | ICD-10-CM | POA: Insufficient documentation

## 2018-07-24 MED ORDER — CLINDAMYCIN HCL 300 MG PO CAPS: 300.0000 mg | ORAL_CAPSULE | Freq: Three times a day (TID) | ORAL | 0 refills | Status: DC
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
## 2018-11-29 ENCOUNTER — Ambulatory Visit
Admission: EM | Admit: 2018-11-29 | Discharge: 2018-11-29 | Disposition: A | Discharge status: Home / Self Care | Payer: Medicaid Other | Attending: Family Medicine | Admitting: Family Medicine

## 2018-11-29 ENCOUNTER — Encounter: Payer: Self-pay | Admitting: Emergency Medicine

## 2018-11-29 ENCOUNTER — Ambulatory Visit: Payer: Medicaid Other

## 2018-11-29 ENCOUNTER — Other Ambulatory Visit: Payer: Self-pay

## 2018-11-29 DIAGNOSIS — Z91030 Bee allergy status: Secondary | ICD-10-CM | POA: Insufficient documentation

## 2018-11-29 DIAGNOSIS — M79643 Pain in unspecified hand: Secondary | ICD-10-CM | POA: Insufficient documentation

## 2018-11-29 DIAGNOSIS — R0781 Pleurodynia: Secondary | ICD-10-CM | POA: Insufficient documentation

## 2018-11-29 DIAGNOSIS — J45909 Unspecified asthma, uncomplicated: Secondary | ICD-10-CM | POA: Insufficient documentation

## 2018-11-29 DIAGNOSIS — M25522 Pain in left elbow: Secondary | ICD-10-CM | POA: Insufficient documentation

## 2018-11-29 DIAGNOSIS — Z888 Allergy status to other drugs, medicaments and biological substances status: Secondary | ICD-10-CM | POA: Insufficient documentation

## 2018-11-29 DIAGNOSIS — Z880 Allergy status to penicillin: Secondary | ICD-10-CM | POA: Insufficient documentation

## 2018-11-29 DIAGNOSIS — Z791 Long term (current) use of non-steroidal anti-inflammatories (NSAID): Secondary | ICD-10-CM | POA: Diagnosis not present

## 2018-11-29 DIAGNOSIS — M546 Pain in thoracic spine: Secondary | ICD-10-CM | POA: Diagnosis not present

## 2018-11-29 DIAGNOSIS — F17210 Nicotine dependence, cigarettes, uncomplicated: Secondary | ICD-10-CM | POA: Insufficient documentation

## 2018-11-29 DIAGNOSIS — Z79899 Other long term (current) drug therapy: Secondary | ICD-10-CM | POA: Diagnosis not present

## 2018-11-29 DIAGNOSIS — H5711 Ocular pain, right eye: Secondary | ICD-10-CM | POA: Insufficient documentation

## 2018-11-29 DIAGNOSIS — Z881 Allergy status to other antibiotic agents status: Secondary | ICD-10-CM | POA: Diagnosis not present

## 2018-11-29 DIAGNOSIS — W228XXA Striking against or struck by other objects, initial encounter: Secondary | ICD-10-CM

## 2018-11-29 DIAGNOSIS — Z885 Allergy status to narcotic agent status: Secondary | ICD-10-CM | POA: Insufficient documentation

## 2018-11-29 DIAGNOSIS — H5789 Other specified disorders of eye and adnexa: Secondary | ICD-10-CM | POA: Diagnosis not present

## 2018-11-29 MED ORDER — KETOROLAC TROMETHAMINE 0.5 % OP SOLN: 1.0000 [drp] | Freq: Four times a day (QID) | OPHTHALMIC | 0 refills | Status: AC

## 2018-11-29 MED ORDER — MELOXICAM 15 MG PO TABS: 15.0000 mg | ORAL_TABLET | Freq: Every day | ORAL | 0 refills | Status: DC | PRN

## 2018-11-29 NOTE — Discharge Instructions (Signed)
Xrays were negative.  Medication as prescribed.  Take care  Dr. Adriana Simas
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
## 2018-11-29 NOTE — ED Triage Notes (Signed)
He has been taking Soma, Tizanidine, Gabapentin, Ibuprofen 800mg  given to him by his mother. Patient states these are his prescriptions but his mother has them to give to him.
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
## 2019-03-29 ENCOUNTER — Emergency Department
Admission: EM | Admit: 2019-03-29 | Discharge: 2019-03-29 | Disposition: A | Discharge status: Home / Self Care | Payer: Medicaid Other | Attending: Emergency Medicine | Admitting: Emergency Medicine

## 2019-03-29 ENCOUNTER — Emergency Department: Payer: Medicaid Other

## 2019-03-29 ENCOUNTER — Encounter: Payer: Self-pay | Admitting: Emergency Medicine

## 2019-03-29 ENCOUNTER — Other Ambulatory Visit: Payer: Self-pay

## 2019-03-29 DIAGNOSIS — Y999 Unspecified external cause status: Secondary | ICD-10-CM | POA: Diagnosis not present

## 2019-03-29 DIAGNOSIS — S0083XA Contusion of other part of head, initial encounter: Secondary | ICD-10-CM | POA: Insufficient documentation

## 2019-03-29 DIAGNOSIS — S20212A Contusion of left front wall of thorax, initial encounter: Secondary | ICD-10-CM

## 2019-03-29 DIAGNOSIS — Y929 Unspecified place or not applicable: Secondary | ICD-10-CM | POA: Insufficient documentation

## 2019-03-29 DIAGNOSIS — F17210 Nicotine dependence, cigarettes, uncomplicated: Secondary | ICD-10-CM | POA: Diagnosis not present

## 2019-03-29 DIAGNOSIS — M79642 Pain in left hand: Secondary | ICD-10-CM | POA: Insufficient documentation

## 2019-03-29 DIAGNOSIS — S0990XA Unspecified injury of head, initial encounter: Secondary | ICD-10-CM | POA: Diagnosis present

## 2019-03-29 DIAGNOSIS — M25512 Pain in left shoulder: Secondary | ICD-10-CM | POA: Diagnosis not present

## 2019-03-29 DIAGNOSIS — J45909 Unspecified asthma, uncomplicated: Secondary | ICD-10-CM | POA: Insufficient documentation

## 2019-03-29 DIAGNOSIS — Y939 Activity, unspecified: Secondary | ICD-10-CM | POA: Insufficient documentation

## 2019-03-29 MED ORDER — IBUPROFEN 600 MG PO TABS: 600.0000 mg | ORAL_TABLET | Freq: Three times a day (TID) | ORAL | 0 refills | Status: DC | PRN

## 2019-03-29 MED ORDER — OXYCODONE-ACETAMINOPHEN 5-325 MG PO TABS: 1.0000 | ORAL_TABLET | Freq: Four times a day (QID) | ORAL | 0 refills | Status: DC | PRN

## 2019-03-29 MED ORDER — OXYCODONE-ACETAMINOPHEN 5-325 MG PO TABS
1.0000 | ORAL_TABLET | Freq: Once | ORAL | Status: AC
  Administered 2019-03-29: 07:00:00 1 via ORAL
  Filled 2019-03-29: qty 1
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
## 2019-03-29 NOTE — ED Notes (Signed)
Pt noted leaving ED lobby, getting in vehicle and leaving parking lot 
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
## 2019-03-29 NOTE — Discharge Instructions (Addendum)
Follow-up with [facility] acute care if any continued problems or concerns.  CT scans and x-rays were negative for any fractured bones.  Use ice to your face and hand as needed for discomfort.  Wear the sling for support of your shoulder.  Take medication only as directed.  Do not drive or operate machinery while taking the oxycodone as this may increase your risk for injury.
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
## 2020-02-01 ENCOUNTER — Ambulatory Visit
Admission: EM | Admit: 2020-02-01 | Discharge: 2020-02-01 | Disposition: A | Discharge status: Home / Self Care | Payer: Medicaid Other | Attending: Family Medicine | Admitting: Family Medicine

## 2020-02-01 ENCOUNTER — Ambulatory Visit
Admission: EM | Admit: 2020-02-01 | Discharge: 2020-02-01 | Discharge status: Against Medical Advice | Payer: Medicaid Other

## 2020-02-01 ENCOUNTER — Encounter: Payer: Self-pay | Admitting: Emergency Medicine

## 2020-02-01 ENCOUNTER — Other Ambulatory Visit: Payer: Self-pay

## 2020-02-01 DIAGNOSIS — S52002B Unspecified fracture of upper end of left ulna, initial encounter for open fracture type I or II: Secondary | ICD-10-CM | POA: Diagnosis not present

## 2020-02-01 MED ORDER — CEFAZOLIN SODIUM-DEXTROSE 2-4 GM/100ML-% IV SOLN
2.00 | INTRAVENOUS | Status: DC
Start: 2020-02-01 — End: 2020-02-01

## 2020-02-01 MED ORDER — ONDANSETRON 4 MG PO TBDP
4.00 | ORAL_TABLET | ORAL | Status: DC
Start: ? — End: 2020-02-01

## 2020-02-01 MED ORDER — OXYCODONE HCL 5 MG PO TABS
5.00 | ORAL_TABLET | ORAL | Status: DC
Start: ? — End: 2020-02-01

## 2020-02-01 MED ORDER — LACTATED RINGERS IV SOLN
100.00 | INTRAVENOUS | Status: DC
Start: ? — End: 2020-02-01

## 2020-02-01 MED ORDER — NICOTINE 21 MG/24HR TD PT24
1.00 | MEDICATED_PATCH | TRANSDERMAL | Status: DC
Start: 2020-02-02 — End: 2020-02-01

## 2020-02-01 MED ORDER — ACETAMINOPHEN 500 MG PO TABS
1000.00 | ORAL_TABLET | ORAL | Status: DC
Start: 2020-01-31 — End: 2020-02-01

## 2020-02-01 MED ORDER — ACETAMINOPHEN 500 MG PO TABS
1000.00 | ORAL_TABLET | ORAL | Status: DC
Start: 2020-02-01 — End: 2020-02-01
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
## 2020-02-01 NOTE — ED Triage Notes (Signed)
Patient states that he was suppose to have surgery on his left elbow this afternoon at [facility] but states that he was discharged from [facility] this morning and was told to follow-up with an Orthopedic in a week.  Patient states that he has called [facility] to get an appointment.  Patient has been up her twice about getting pain medicine.  The first time this morning he left AMA.  Patient states that he is out of his Percocet with was filled yesterday and admits to taking the Percocet more frequently than prescribed.

## 2020-02-01 NOTE — Discharge Instructions (Signed)
Please go to [facility] ER.  Best of luck  Dr. Adriana Simas

## 2020-02-05 ENCOUNTER — Observation Stay
Admission: EM | Admit: 2020-02-05 | Discharge: 2020-02-06 | Disposition: A | Discharge status: Home / Self Care | Payer: Medicaid Other | Attending: Surgery | Admitting: Surgery

## 2020-02-05 ENCOUNTER — Encounter: Payer: Self-pay | Admitting: Emergency Medicine

## 2020-02-05 ENCOUNTER — Emergency Department: Payer: Medicaid Other

## 2020-02-05 ENCOUNTER — Other Ambulatory Visit: Payer: Self-pay

## 2020-02-05 ENCOUNTER — Observation Stay: Payer: Medicaid Other

## 2020-02-05 ENCOUNTER — Observation Stay: Payer: Medicaid Other | Admitting: Anesthesiology

## 2020-02-05 ENCOUNTER — Encounter
Admission: EM | Disposition: A | Discharge status: Home / Self Care | Payer: Self-pay | Source: Home / Self Care | Attending: Emergency Medicine

## 2020-02-05 DIAGNOSIS — Z885 Allergy status to narcotic agent status: Secondary | ICD-10-CM | POA: Diagnosis not present

## 2020-02-05 DIAGNOSIS — J45909 Unspecified asthma, uncomplicated: Secondary | ICD-10-CM | POA: Insufficient documentation

## 2020-02-05 DIAGNOSIS — Z20822 Contact with and (suspected) exposure to covid-19: Secondary | ICD-10-CM | POA: Insufficient documentation

## 2020-02-05 DIAGNOSIS — F17210 Nicotine dependence, cigarettes, uncomplicated: Secondary | ICD-10-CM | POA: Insufficient documentation

## 2020-02-05 DIAGNOSIS — S52022B Displaced fracture of olecranon process without intraarticular extension of left ulna, initial encounter for open fracture type I or II: Principal | ICD-10-CM | POA: Insufficient documentation

## 2020-02-05 DIAGNOSIS — Z880 Allergy status to penicillin: Secondary | ICD-10-CM | POA: Diagnosis not present

## 2020-02-05 DIAGNOSIS — Z881 Allergy status to other antibiotic agents status: Secondary | ICD-10-CM | POA: Insufficient documentation

## 2020-02-05 DIAGNOSIS — Z91030 Bee allergy status: Secondary | ICD-10-CM | POA: Diagnosis not present

## 2020-02-05 DIAGNOSIS — Z419 Encounter for procedure for purposes other than remedying health state, unspecified: Secondary | ICD-10-CM

## 2020-02-05 DIAGNOSIS — M419 Scoliosis, unspecified: Secondary | ICD-10-CM | POA: Insufficient documentation

## 2020-02-05 HISTORY — PX: ORIF ELBOW FRACTURE: SHX5031

## 2020-02-05 LAB — BASIC METABOLIC PANEL
Anion gap: 9 (ref 5–15)
BUN: 12 mg/dL (ref 6–20)
CO2: 27 mmol/L (ref 22–32)
Calcium: 8.7 mg/dL — ABNORMAL LOW (ref 8.9–10.3)
Chloride: 104 mmol/L (ref 98–111)
Creatinine, Ser: 0.84 mg/dL (ref 0.61–1.24)
GFR calc Af Amer: >60 mL/min (ref >60)
GFR calc non Af Amer: >60 mL/min (ref >60)
Glucose, Bld: 114 mg/dL — ABNORMAL HIGH (ref 70–99)
Potassium: 3.7 mmol/L (ref 3.5–5.1)
Sodium: 140 mmol/L (ref 135–145)

## 2020-02-05 LAB — CBC
HCT: 40.3 % (ref 39.0–52.0)
Hemoglobin: 13.5 g/dL (ref 13.0–17.0)
MCH: 30.8 pg (ref 26.0–34.0)
MCHC: 33.5 g/dL (ref 30.0–36.0)
MCV: 91.8 fL (ref 80.0–100.0)
Platelets: 288 10*3/uL (ref 150–400)
RBC: 4.39 MIL/uL (ref 4.22–5.81)
RDW: 12.7 % (ref 11.5–15.5)
WBC: 7.8 10*3/uL (ref 4.0–10.5)
nRBC: 0 % (ref 0.0–0.2)

## 2020-02-05 LAB — SEDIMENTATION RATE: Sed Rate: 3 mm/hr (ref 0–15)

## 2020-02-05 LAB — URINE DRUG SCREEN, QUALITATIVE (ARMC ONLY)
Amphetamines, Ur Screen: NOT DETECTED
Barbiturates, Ur Screen: NOT DETECTED
Benzodiazepine, Ur Scrn: NOT DETECTED
Cannabinoid 50 Ng, Ur ~~LOC~~: NOT DETECTED
Cocaine Metabolite,Ur ~~LOC~~: POSITIVE — AB
MDMA (Ecstasy)Ur Screen: NOT DETECTED
Methadone Scn, Ur: NOT DETECTED
Opiate, Ur Screen: POSITIVE — AB
Phencyclidine (PCP) Ur S: NOT DETECTED
Tricyclic, Ur Screen: NOT DETECTED

## 2020-02-05 LAB — RESPIRATORY PANEL BY RT PCR (FLU A&B, COVID)
Influenza A by PCR: NEGATIVE
Influenza B by PCR: NEGATIVE
SARS Coronavirus 2 by RT PCR: NEGATIVE

## 2020-02-05 LAB — CK: Total CK: 120 U/L (ref 49–397)

## 2020-02-05 SURGERY — OPEN REDUCTION INTERNAL FIXATION (ORIF) ELBOW/OLECRANON FRACTURE
Anesthesia: General | Site: Elbow | Laterality: Left

## 2020-02-05 MED ORDER — FENTANYL CITRATE (PF) 100 MCG/2ML IJ SOLN
25.0000 ug | INTRAMUSCULAR | Status: DC | PRN
  Administered 2020-02-05: 50 ug via INTRAVENOUS

## 2020-02-05 MED ORDER — HYDROMORPHONE HCL 1 MG/ML IJ SOLN: 0.2500 mg | INTRAMUSCULAR | Status: DC | PRN

## 2020-02-05 MED ORDER — ACETAMINOPHEN 10 MG/ML IV SOLN
INTRAVENOUS | Status: DC | PRN
  Administered 2020-02-05: 1000 mg via INTRAVENOUS

## 2020-02-05 MED ORDER — FENTANYL CITRATE (PF) 100 MCG/2ML IJ SOLN
INTRAMUSCULAR | Status: AC
  Administered 2020-02-05: 50 ug via INTRAVENOUS
  Filled 2020-02-05: qty 2

## 2020-02-05 MED ORDER — ACETAMINOPHEN 10 MG/ML IV SOLN
INTRAVENOUS | Status: AC
  Filled 2020-02-05: qty 100

## 2020-02-05 MED ORDER — ONDANSETRON HCL 4 MG/2ML IJ SOLN
INTRAMUSCULAR | Status: DC | PRN
  Administered 2020-02-05: 4 mg via INTRAVENOUS

## 2020-02-05 MED ORDER — KETOROLAC TROMETHAMINE 30 MG/ML IJ SOLN
INTRAMUSCULAR | Status: AC
  Filled 2020-02-05: qty 1

## 2020-02-05 MED ORDER — SUCCINYLCHOLINE CHLORIDE 200 MG/10ML IV SOSY
PREFILLED_SYRINGE | INTRAVENOUS | Status: AC
  Filled 2020-02-05: qty 10

## 2020-02-05 MED ORDER — SUGAMMADEX SODIUM 500 MG/5ML IV SOLN
INTRAVENOUS | Status: AC
  Filled 2020-02-05: qty 5

## 2020-02-05 MED ORDER — MIDAZOLAM HCL 2 MG/2ML IJ SOLN
INTRAMUSCULAR | Status: AC
  Filled 2020-02-05: qty 4

## 2020-02-05 MED ORDER — LACTATED RINGERS IV SOLN: INTRAVENOUS | Status: DC | PRN

## 2020-02-05 MED ORDER — BUPIVACAINE HCL (PF) 0.5 % IJ SOLN
INTRAMUSCULAR | Status: DC | PRN
  Administered 2020-02-05: 15 mL

## 2020-02-05 MED ORDER — PROPOFOL 10 MG/ML IV BOLUS
INTRAVENOUS | Status: DC | PRN
  Administered 2020-02-05: 250 mg via INTRAVENOUS

## 2020-02-05 MED ORDER — KETOROLAC TROMETHAMINE 30 MG/ML IJ SOLN
INTRAMUSCULAR | Status: DC | PRN
  Administered 2020-02-05: 30 mg via INTRAVENOUS

## 2020-02-05 MED ORDER — KETAMINE HCL 50 MG/ML IJ SOLN
INTRAMUSCULAR | Status: AC
  Filled 2020-02-05: qty 10

## 2020-02-05 MED ORDER — ONDANSETRON HCL 4 MG/2ML IJ SOLN
4.0000 mg | Freq: Once | INTRAMUSCULAR | Status: AC
  Administered 2020-02-05: 16:00:00 4 mg via INTRAVENOUS
  Filled 2020-02-05: qty 2

## 2020-02-05 MED ORDER — NALOXONE HCL 0.4 MG/ML IJ SOLN
INTRAMUSCULAR | Status: AC
  Filled 2020-02-05: qty 1

## 2020-02-05 MED ORDER — SUGAMMADEX SODIUM 200 MG/2ML IV SOLN
INTRAVENOUS | Status: DC | PRN
  Administered 2020-02-05: 200 mg via INTRAVENOUS

## 2020-02-05 MED ORDER — FENTANYL CITRATE (PF) 250 MCG/5ML IJ SOLN
INTRAMUSCULAR | Status: AC
  Filled 2020-02-05: qty 5

## 2020-02-05 MED ORDER — FENTANYL CITRATE (PF) 100 MCG/2ML IJ SOLN
INTRAMUSCULAR | Status: DC | PRN
  Administered 2020-02-05: 100 ug via INTRAVENOUS
  Administered 2020-02-05: 50 ug via INTRAVENOUS

## 2020-02-05 MED ORDER — KETAMINE HCL 50 MG/ML IJ SOLN
INTRAMUSCULAR | Status: DC | PRN
  Administered 2020-02-05: 25 mg via INTRAMUSCULAR
  Administered 2020-02-05: 50 mg via INTRAMUSCULAR
  Administered 2020-02-05: 25 mg via INTRAMUSCULAR

## 2020-02-05 MED ORDER — HYDROMORPHONE HCL 1 MG/ML IJ SOLN
INTRAMUSCULAR | Status: AC
  Administered 2020-02-05: 1 mg via INTRAVENOUS
  Filled 2020-02-05: qty 1

## 2020-02-05 MED ORDER — MIDAZOLAM HCL 2 MG/2ML IJ SOLN
INTRAMUSCULAR | Status: DC | PRN
  Administered 2020-02-05: 4 mg via INTRAVENOUS

## 2020-02-05 MED ORDER — SODIUM CHLORIDE 0.9 % IV BOLUS
1000.0000 mL | Freq: Once | INTRAVENOUS | Status: AC
  Administered 2020-02-05: 16:00:00 1000 mL via INTRAVENOUS

## 2020-02-05 MED ORDER — SEVOFLURANE IN SOLN
RESPIRATORY_TRACT | Status: AC
  Filled 2020-02-05: qty 250

## 2020-02-05 MED ORDER — HYDROMORPHONE HCL 1 MG/ML IJ SOLN
1.0000 mg | Freq: Once | INTRAMUSCULAR | Status: AC
  Administered 2020-02-05: 1 mg via INTRAVENOUS
  Filled 2020-02-05: qty 1

## 2020-02-05 MED ORDER — DEXAMETHASONE SODIUM PHOSPHATE 10 MG/ML IJ SOLN
INTRAMUSCULAR | Status: AC
  Filled 2020-02-05: qty 1

## 2020-02-05 MED ORDER — KETOROLAC TROMETHAMINE 30 MG/ML IJ SOLN
30.0000 mg | Freq: Once | INTRAMUSCULAR | Status: AC
  Administered 2020-02-06: 30 mg via INTRAVENOUS

## 2020-02-05 MED ORDER — LORAZEPAM 2 MG/ML IJ SOLN
INTRAMUSCULAR | Status: AC
  Filled 2020-02-05: qty 1

## 2020-02-05 MED ORDER — ROCURONIUM BROMIDE 100 MG/10ML IV SOLN
INTRAVENOUS | Status: DC | PRN
  Administered 2020-02-05: 30 mg via INTRAVENOUS

## 2020-02-05 MED ORDER — SUCCINYLCHOLINE CHLORIDE 20 MG/ML IJ SOLN
INTRAMUSCULAR | Status: DC | PRN
  Administered 2020-02-05: 120 mg via INTRAVENOUS

## 2020-02-05 MED ORDER — HYDROMORPHONE HCL 1 MG/ML IJ SOLN
1.0000 mg | INTRAMUSCULAR | Status: DC | PRN
  Administered 2020-02-06: 1 mg via INTRAVENOUS

## 2020-02-05 MED ORDER — NALOXONE HCL 0.4 MG/ML IJ SOLN
INTRAMUSCULAR | Status: DC | PRN
Start: 2020-02-05 — End: 2020-02-05
  Administered 2020-02-05: 40 ug via INTRAVENOUS

## 2020-02-05 MED ORDER — LIDOCAINE HCL (CARDIAC) PF 100 MG/5ML IV SOSY
PREFILLED_SYRINGE | INTRAVENOUS | Status: DC | PRN
  Administered 2020-02-05: 90 mg via INTRAVENOUS

## 2020-02-05 MED ORDER — ONDANSETRON HCL 4 MG/2ML IJ SOLN: 4.0000 mg | Freq: Once | INTRAMUSCULAR | Status: DC | PRN

## 2020-02-05 MED ORDER — HYDROMORPHONE HCL 1 MG/ML IJ SOLN
1.0000 mg | Freq: Once | INTRAMUSCULAR | Status: AC
  Administered 2020-02-05: 18:00:00 1 mg via INTRAVENOUS
  Filled 2020-02-05: qty 1

## 2020-02-05 MED ORDER — PROPOFOL 10 MG/ML IV BOLUS
INTRAVENOUS | Status: AC
  Filled 2020-02-05: qty 40

## 2020-02-05 MED ORDER — DEXAMETHASONE SODIUM PHOSPHATE 10 MG/ML IJ SOLN
INTRAMUSCULAR | Status: DC | PRN
  Administered 2020-02-05: 10 mg via INTRAVENOUS

## 2020-02-05 MED ORDER — LIDOCAINE HCL (PF) 2 % IJ SOLN
INTRAMUSCULAR | Status: AC
  Filled 2020-02-05: qty 10

## 2020-02-05 MED ORDER — ROCURONIUM BROMIDE 10 MG/ML (PF) SYRINGE
PREFILLED_SYRINGE | INTRAVENOUS | Status: AC
  Filled 2020-02-05: qty 10

## 2020-02-05 MED ORDER — PHENYLEPHRINE HCL (PRESSORS) 10 MG/ML IV SOLN
INTRAVENOUS | Status: DC | PRN
  Administered 2020-02-05 (×2): 100 ug via INTRAVENOUS

## 2020-02-05 MED ORDER — MORPHINE SULFATE (PF) 4 MG/ML IV SOLN: 4.0000 mg | Freq: Once | INTRAVENOUS | Status: DC

## 2020-02-05 MED ORDER — ONDANSETRON HCL 4 MG/2ML IJ SOLN
INTRAMUSCULAR | Status: AC
  Filled 2020-02-05: qty 2

## 2020-02-05 MED ORDER — CEFAZOLIN SODIUM-DEXTROSE 2-4 GM/100ML-% IV SOLN
2.0000 g | Freq: Once | INTRAVENOUS | Status: AC
  Administered 2020-02-05: 2 g via INTRAVENOUS
  Filled 2020-02-05: qty 100

## 2020-02-05 MED ORDER — LORAZEPAM 2 MG/ML IJ SOLN
2.0000 mg | Freq: Once | INTRAMUSCULAR | Status: AC
  Administered 2020-02-06: 2 mg via INTRAVENOUS

## 2020-02-05 SURGICAL SUPPLY — 60 items
BIT DRILL 2.5X2.75 QC CALB (BIT) ×2 IMPLANT
BIT DRILL CALIBRATED 2.7 (BIT) ×2 IMPLANT
BNDG COHESIVE 4X5 TAN STRL (GAUZE/BANDAGES/DRESSINGS) ×2 IMPLANT
BNDG ELASTIC 4X5.8 VLCR NS LF (GAUZE/BANDAGES/DRESSINGS) ×4 IMPLANT
CANISTER SUCT 1200ML W/VALVE (MISCELLANEOUS) IMPLANT
CANISTER SUCT 3000ML PPV (MISCELLANEOUS) ×2 IMPLANT
CHLORAPREP W/TINT 26 (MISCELLANEOUS) ×2 IMPLANT
CNTNR SPEC 2.5X3XGRAD LEK (MISCELLANEOUS) ×1
CONT SPEC 4OZ STER OR WHT (MISCELLANEOUS) ×1
CONTAINER SPEC 2.5X3XGRAD LEK (MISCELLANEOUS) ×1 IMPLANT
COVER WAND RF STERILE (DRAPES) ×2 IMPLANT
CUFF TOURN 18 STER (MISCELLANEOUS) ×2 IMPLANT
CUFF TOURN 24 STER (MISCELLANEOUS) IMPLANT
DRAPE 3/4 80X56 (DRAPES) ×2 IMPLANT
DRAPE C-ARM XRAY 36X54 (DRAPES) ×2 IMPLANT
ELECT CAUTERY BLADE 6.4 (BLADE) ×2 IMPLANT
ELECT REM PT RETURN 9FT ADLT (ELECTROSURGICAL) ×2
ELECTRODE REM PT RTRN 9FT ADLT (ELECTROSURGICAL) ×1 IMPLANT
GAUZE SPONGE 4X4 12PLY STRL (GAUZE/BANDAGES/DRESSINGS) ×2 IMPLANT
GAUZE XEROFORM 1X8 LF (GAUZE/BANDAGES/DRESSINGS) ×2 IMPLANT
GLOVE SURG SYN 9.0  PF PI (GLOVE) ×1
GLOVE SURG SYN 9.0 PF PI (GLOVE) ×1 IMPLANT
GOWN SRG 2XL LVL 4 RGLN SLV (GOWNS) ×1 IMPLANT
GOWN STRL NON-REIN 2XL LVL4 (GOWNS) ×1
GOWN STRL REUS W/ TWL LRG LVL3 (GOWN DISPOSABLE) ×1 IMPLANT
GOWN STRL REUS W/TWL LRG LVL3 (GOWN DISPOSABLE) ×1
K-WIRE ACE 1.6X6 (WIRE) ×4
KIT TURNOVER KIT A (KITS) ×2 IMPLANT
KWIRE ACE 1.6X6 (WIRE) ×2 IMPLANT
NEEDLE FILTER BLUNT 18X 1/2SAF (NEEDLE) ×1
NEEDLE FILTER BLUNT 18X1 1/2 (NEEDLE) ×1 IMPLANT
NS IRRIG 1000ML POUR BTL (IV SOLUTION) ×2 IMPLANT
NS IRRIG 500ML POUR BTL (IV SOLUTION) ×2 IMPLANT
PACK EXTREMITY (MISCELLANEOUS) ×2 IMPLANT
PAD ABD DERMACEA PRESS 5X9 (GAUZE/BANDAGES/DRESSINGS) IMPLANT
PAD CAST CTTN 4X4 STRL (SOFTGOODS) ×1 IMPLANT
PAD PREP 24X41 OB/GYN DISP (PERSONAL CARE ITEMS) ×2 IMPLANT
PADDING CAST COTTON 4X4 STRL (SOFTGOODS) ×1
PLATE OLECRANON SM (Plate) ×2 IMPLANT
SCALPEL PROTECTED #15 DISP (BLADE) ×4 IMPLANT
SCREW CORT 3.5X26 (Screw) ×1 IMPLANT
SCREW CORT T15 26X3.5XST LCK (Screw) ×1 IMPLANT
SCREW CORT T15 28X3.5XST LCK (Screw) ×1 IMPLANT
SCREW CORTICAL 3.5X28MM (Screw) ×1 IMPLANT
SCREW LOCK CORT STAR 3.5X14 (Screw) ×4 IMPLANT
SCREW LOCK CORT STAR 3.5X18 (Screw) ×4 IMPLANT
SCREW LOW PROFILE 22MMX3.5MM (Screw) ×2 IMPLANT
SCREW LP 3.5X65MM (Screw) ×2 IMPLANT
SPLINT CAST 1 STEP 4X30 (MISCELLANEOUS) ×2 IMPLANT
SPLINT CAST 1 STEP 5X30 WHT (MISCELLANEOUS) IMPLANT
SPONGE LAP 18X18 RF (DISPOSABLE) ×2 IMPLANT
STAPLER SKIN PROX 35W (STAPLE) ×2 IMPLANT
STRIP CLOSURE SKIN 1/2X4 (GAUZE/BANDAGES/DRESSINGS) IMPLANT
SUT ETHILON 3-0 FS-10 30 BLK (SUTURE) ×2
SUT VIC AB 0 CT2 27 (SUTURE) ×2 IMPLANT
SUT VIC AB 2-0 CT1 (SUTURE) ×2 IMPLANT
SUT VIC AB 2-0 CT2 27 (SUTURE) ×2 IMPLANT
SUTURE EHLN 3-0 FS-10 30 BLK (SUTURE) ×1 IMPLANT
SYR 5ML LL (SYRINGE) ×2 IMPLANT
WASHER 3.5MM (Orthopedic Implant) ×2 IMPLANT
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
## 2020-02-05 NOTE — ED Notes (Signed)
Late entry   Pt's arm was placed in OCL splint and sling was given  Wife at bedside at 1600  the patient has left his room several times with wife to go outside    Informed pt to stay in room .  1650 additional pain meds given  1835  Dr Joice Lofts in with pt and family  Explained the wait
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
## 2020-02-05 NOTE — Transfer of Care (Signed)
Immediate Anesthesia Transfer of Care Note  Patient: [name]  Procedure(s) Performed: OPEN REDUCTION INTERNAL FIXATION (ORIF) ELBOW/OLECRANON FRACTURE (Left Elbow)  Patient Location: PACU  Anesthesia Type:General  Level of Consciousness: awake and patient cooperative  Airway & Oxygen Therapy: Patient Spontanous Breathing  Post-op Assessment: Report given to RN and Post -op Vital signs reviewed and stable  Post vital signs: Reviewed and stable  Last Vitals:  Vitals Value Taken Time  BP    Temp    Pulse    Resp    SpO2      Last Pain:  Vitals:   02/05/20 1846  TempSrc: Oral  PainSc:          Complications: No apparent anesthesia complications

## 2020-02-05 NOTE — Anesthesia Procedure Notes (Signed)
Procedure Name: Intubation Date/Time: 02/05/2020 9:02 PM Performed by: Waldo Laine, CRNA Pre-anesthesia Checklist: Patient identified, Patient being monitored, Timeout performed, Emergency Drugs available and Suction available Patient Re-evaluated:Patient Re-evaluated prior to induction Oxygen Delivery Method: Circle system utilized Preoxygenation: Pre-oxygenation with 100% oxygen Induction Type: IV induction, Rapid sequence and Cricoid Pressure applied Laryngoscope Size: 3 and McGraph Grade View: Grade I Tube type: Oral Tube size: 7.5 mm Number of attempts: 1 Airway Equipment and Method: Stylet Placement Confirmation: ETT inserted through vocal cords under direct vision,  positive ETCO2 and breath sounds checked- equal and bilateral Secured at: 21 cm Tube secured with: Tape Dental Injury: Teeth and Oropharynx as per pre-operative assessment
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
## 2020-02-06 LAB — C-REACTIVE PROTEIN: CRP: 0.9 mg/dL (ref <1.0)

## 2020-02-06 MED ORDER — KETOROLAC TROMETHAMINE 15 MG/ML IJ SOLN
15.0000 mg | Freq: Four times a day (QID) | INTRAMUSCULAR | Status: DC
  Administered 2020-02-06 (×3): 15 mg via INTRAVENOUS
  Filled 2020-02-06 (×3): qty 1

## 2020-02-06 MED ORDER — HYDROMORPHONE HCL 1 MG/ML IJ SOLN
INTRAMUSCULAR | Status: AC
  Administered 2020-02-06: 1 mg via INTRAVENOUS
  Filled 2020-02-06: qty 1

## 2020-02-06 MED ORDER — DIPHENHYDRAMINE HCL 12.5 MG/5ML PO ELIX: 12.5000 mg | ORAL_SOLUTION | ORAL | Status: DC | PRN

## 2020-02-06 MED ORDER — KETOROLAC TROMETHAMINE 30 MG/ML IJ SOLN
INTRAMUSCULAR | Status: AC
  Filled 2020-02-06: qty 1

## 2020-02-06 MED ORDER — OXYCODONE HCL 10 MG PO TABS: 10.0000 mg | ORAL_TABLET | Freq: Four times a day (QID) | ORAL | 0 refills | Status: AC | PRN

## 2020-02-06 MED ORDER — HYDROMORPHONE HCL 1 MG/ML IJ SOLN
INTRAMUSCULAR | Status: AC
  Filled 2020-02-06: qty 1

## 2020-02-06 MED ORDER — DOCUSATE SODIUM 100 MG PO CAPS
100.0000 mg | ORAL_CAPSULE | Freq: Two times a day (BID) | ORAL | Status: DC
  Administered 2020-02-06: 100 mg via ORAL
  Filled 2020-02-06 (×2): qty 1

## 2020-02-06 MED ORDER — METOCLOPRAMIDE HCL 5 MG/ML IJ SOLN: 5.0000 mg | Freq: Three times a day (TID) | INTRAMUSCULAR | Status: DC | PRN

## 2020-02-06 MED ORDER — MAGNESIUM HYDROXIDE 400 MG/5ML PO SUSP
30.0000 mL | Freq: Every day | ORAL | Status: DC | PRN
  Filled 2020-02-06: qty 30

## 2020-02-06 MED ORDER — BISACODYL 10 MG RE SUPP: 10.0000 mg | Freq: Every day | RECTAL | Status: DC | PRN

## 2020-02-06 MED ORDER — FLEET ENEMA 7-19 GM/118ML RE ENEM: 1.0000 | ENEMA | Freq: Once | RECTAL | Status: DC | PRN

## 2020-02-06 MED ORDER — SODIUM CHLORIDE 0.9 % IV SOLN: INTRAVENOUS | Status: DC

## 2020-02-06 MED ORDER — ONDANSETRON HCL 4 MG/2ML IJ SOLN: 4.0000 mg | Freq: Four times a day (QID) | INTRAMUSCULAR | Status: DC | PRN

## 2020-02-06 MED ORDER — ACETAMINOPHEN 325 MG PO TABS
325.0000 mg | ORAL_TABLET | Freq: Four times a day (QID) | ORAL | Status: DC | PRN
  Filled 2020-02-06: qty 2

## 2020-02-06 MED ORDER — CEFAZOLIN SODIUM-DEXTROSE 2-4 GM/100ML-% IV SOLN
2.0000 g | Freq: Three times a day (TID) | INTRAVENOUS | Status: DC
  Administered 2020-02-06 (×2): 2 g via INTRAVENOUS
  Filled 2020-02-06 (×3): qty 100

## 2020-02-06 MED ORDER — ONDANSETRON HCL 4 MG PO TABS: 4.0000 mg | ORAL_TABLET | Freq: Four times a day (QID) | ORAL | Status: DC | PRN

## 2020-02-06 MED ORDER — OXYCODONE HCL 5 MG PO TABS
10.0000 mg | ORAL_TABLET | ORAL | Status: DC | PRN
  Administered 2020-02-06 (×2): 15 mg via ORAL
  Filled 2020-02-06 (×3): qty 3

## 2020-02-06 MED ORDER — SULFAMETHOXAZOLE-TRIMETHOPRIM 800-160 MG PO TABS: 1.0000 | ORAL_TABLET | Freq: Two times a day (BID) | ORAL | 0 refills | Status: AC

## 2020-02-06 MED ORDER — METOCLOPRAMIDE HCL 10 MG PO TABS: 5.0000 mg | ORAL_TABLET | Freq: Three times a day (TID) | ORAL | Status: DC | PRN

## 2020-02-06 NOTE — Discharge Instructions (Signed)
-  Keep left arm in splint. -Keep arm elevated and apply ice the left arm on a routine basis. -Take pain medication as directed and antibiotic as directed. -Follow-up next week for a skin check.

## 2020-02-06 NOTE — Anesthesia Postprocedure Evaluation (Signed)
Anesthesia Post Note  Patient: [name]  Procedure(s) Performed: OPEN REDUCTION INTERNAL FIXATION (ORIF) ELBOW/OLECRANON FRACTURE (Left Elbow)  Patient location during evaluation: PACU Anesthesia Type: General Level of consciousness: awake and alert Pain management: pain level controlled Vital Signs Assessment: post-procedure vital signs reviewed and stable Respiratory status: spontaneous breathing, nonlabored ventilation, respiratory function stable and patient connected to nasal cannula oxygen Cardiovascular status: blood pressure returned to baseline and stable Postop Assessment: no apparent nausea or vomiting Anesthetic complications: no     Last Vitals:  Vitals:   02/06/20 0046 02/06/20 0517  BP: 133/88 (!) 141/95  Pulse: 91 75  Resp: 10 16  Temp: 36.4 C 36.8 C  SpO2: 95% 97%    Last Pain:  Vitals:   02/06/20 0517  TempSrc: Oral  PainSc:                  Corinda Gubler

## 2020-02-06 NOTE — Progress Notes (Signed)
  Subjective: 1 Day Post-Op Procedure(s) (LRB): OPEN REDUCTION INTERNAL FIXATION (ORIF) ELBOW/OLECRANON FRACTURE (Left) Patient reports pain as 9 on 0-10 scale.   Patient is well, and has had no acute complaints or problems Plan is to go Home after hospital stay. Negative for chest pain and shortness of breath Fever: no Gastrointestinal:Negative for nausea and vomiting  Objective: Vital signs in last 24 hours: Temp:  [97.3 F (36.3 C)-98.4 F (36.9 C)] 97.8 F (36.6 C) (04/28 0738) Pulse Rate:  [75-99] 80 (04/28 0738) Resp:  [7-34] 16 (04/28 0738) BP: (133-155)/(78-115) 146/95 (04/28 0738) SpO2:  [81 %-99 %] 99 % (04/28 0738) Weight:  [90 kg-90.7 kg] 90 kg (04/27 1331)  Intake/Output from previous day:  Intake/Output Summary (Last 24 hours) at 02/06/2020 1249 Last data filed at 02/06/2020 0600 Gross per 24 hour  Intake 2200 ml  Output 5 ml  Net 2195 ml    Intake/Output this shift: No intake/output data recorded.  Labs: Recent Labs    02/05/20 1536  HGB 13.5   Recent Labs    02/05/20 1536  WBC 7.8  RBC 4.39  HCT 40.3  PLT 288   Recent Labs    02/05/20 1536  NA 140  K 3.7  CL 104  CO2 27  BUN 12  CREATININE 0.84  GLUCOSE 114*  CALCIUM 8.7*   No results for input(s): LABPT, INR in the last 72 hours.   EXAM General - Patient is Alert, Appropriate and Oriented Extremity - ABD soft  Posterior splint intact to the left arm. Able to flex and extend fingers on command. Intact to light touch to the dorsal and volar aspect of his hand. Dressing/Incision - clean, dry, no drainage Motor Function - intact, moving foot and toes well on exam.   Past Medical History:  Diagnosis Date  . Abscess   . Asthma   . Chronic back pain   . Coughing up blood   . Kyphosis   . Scoliosis     Assessment/Plan: 1 Day Post-Op Procedure(s) (LRB): OPEN REDUCTION INTERNAL FIXATION (ORIF) ELBOW/OLECRANON FRACTURE (Left) Active Problems:   Olecranon fracture, left, open  type I or II, initial encounter  Estimated body mass index is 26.91 kg/m as calculated from the following:   Height as of this encounter: 6' (1.829 m).   Weight as of this encounter: 90 kg. Advance diet   Cultures pending from surgery. Will plan for discharge home after he finishes his after dose of IV antibiotics. Will send in prescriptions electronically to [facility]. Remain in splint until first follow-up appointment.  DVT Prophylaxis - Foot Pumps and TED hose Non-weightbearing to the left arm.  Valeria Batman, PA-C [facility] 02/06/2020, 12:49 PM

## 2020-02-06 NOTE — TOC Transition Note (Signed)
Transition of Care [facility]) - CM/SW Discharge Note   Patient Details  Name: [name] MRN: [id number] Date of Birth: 27-Jan-1996  Transition of Care [facility]) CM/SW Contact:  Barrie Dunker, RN Phone Number: 02/06/2020, 11:41 AM   Clinical Narrative:     Patient stated that he does not have transportation, I provided the bedside nurse with a Taxi voucher, I explained to him with him having Medicaid they would help with his medication, He asked about narcotics, I explained that we could not provide narcaotics and he would have to get his prescriptions filled at his pharmacy, He requested that they escribe them to [facility], I notified the PA and the physician        Patient Goals and CMS Choice        Discharge Placement                       Discharge Plan and Services                                     Social Determinants of Health (SDOH) Interventions     Readmission Risk Interventions No flowsheet data found.

## 2020-02-06 NOTE — OR Nursing (Signed)
Crying uncontrollably, left elbow hurting.  Asked if could give ativan, OK'ed.
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
## 2020-02-06 NOTE — Plan of Care (Signed)

## 2020-02-11 LAB — AEROBIC/ANAEROBIC CULTURE W GRAM STAIN (SURGICAL/DEEP WOUND)
Culture: NORMAL
Culture: NORMAL

## 2021-05-26 ENCOUNTER — Other Ambulatory Visit: Payer: Self-pay

## 2021-05-26 ENCOUNTER — Emergency Department (HOSPITAL_COMMUNITY)
Admission: EM | Admit: 2021-05-26 | Discharge: 2021-05-26 | Disposition: A | Discharge status: Home / Self Care | Payer: No Typology Code available for payment source | Attending: Emergency Medicine | Admitting: Emergency Medicine

## 2021-05-26 ENCOUNTER — Encounter (HOSPITAL_COMMUNITY): Payer: Self-pay | Admitting: Emergency Medicine

## 2021-05-26 DIAGNOSIS — Z5321 Procedure and treatment not carried out due to patient leaving prior to being seen by health care provider: Secondary | ICD-10-CM | POA: Insufficient documentation

## 2021-05-26 DIAGNOSIS — F1190 Opioid use, unspecified, uncomplicated: Secondary | ICD-10-CM | POA: Insufficient documentation

## 2021-05-26 DIAGNOSIS — F410 Panic disorder [episodic paroxysmal anxiety] without agoraphobia: Secondary | ICD-10-CM | POA: Diagnosis present

## 2021-05-26 DIAGNOSIS — R519 Headache, unspecified: Secondary | ICD-10-CM | POA: Insufficient documentation

## 2021-05-26 DIAGNOSIS — F1490 Cocaine use, unspecified, uncomplicated: Secondary | ICD-10-CM | POA: Diagnosis not present
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
## 2021-05-26 NOTE — ED Triage Notes (Signed)
Pt BIB [facility], c/o headache and feeling anxious after using heroin and cocaine today. Pt in [employer] custody. Pt tearful.

## 2021-05-26 NOTE — ED Notes (Signed)
Pt ambulated to restroom and disappeared. PD had taken his handcuffs off and left. Pt nowhere to be found. Pt left AMA.

## 2021-05-26 NOTE — ED Notes (Signed)
Pt given a coke 
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
## 2023-10-28 DIAGNOSIS — L03115 Cellulitis of right lower limb: Secondary | ICD-10-CM | POA: Diagnosis not present

## 2023-10-28 DIAGNOSIS — R21 Rash and other nonspecific skin eruption: Secondary | ICD-10-CM | POA: Diagnosis not present

## 2023-10-28 DIAGNOSIS — L03116 Cellulitis of left lower limb: Secondary | ICD-10-CM | POA: Diagnosis not present
